# Patient Record
Sex: Male | Born: 1965 | Race: White | Hispanic: No | Smoking: Current every day smoker
Health system: Southern US, Community
[De-identification: ages and names within clinical notes are randomized; demographics above are authoritative.]

## PROBLEM LIST (undated history)

## (undated) DIAGNOSIS — F32A Depression, unspecified: Secondary | ICD-10-CM

## (undated) DIAGNOSIS — K8020 Calculus of gallbladder without cholecystitis without obstruction: Secondary | ICD-10-CM

## (undated) DIAGNOSIS — F329 Major depressive disorder, single episode, unspecified: Secondary | ICD-10-CM

---

## 2018-06-13 ENCOUNTER — Encounter (HOSPITAL_COMMUNITY): Payer: Self-pay

## 2018-06-13 ENCOUNTER — Emergency Department (HOSPITAL_COMMUNITY)
Admission: EM | Admit: 2018-06-13 | Discharge: 2018-06-13 | Disposition: A | Discharge status: Home / Self Care | Payer: Self-pay | Attending: Emergency Medicine | Admitting: Emergency Medicine

## 2018-06-13 DIAGNOSIS — F17200 Nicotine dependence, unspecified, uncomplicated: Secondary | ICD-10-CM | POA: Insufficient documentation

## 2018-06-13 DIAGNOSIS — R1011 Right upper quadrant pain: Secondary | ICD-10-CM | POA: Insufficient documentation

## 2018-06-13 HISTORY — DX: Depression, unspecified: F32.A

## 2018-06-13 HISTORY — DX: Major depressive disorder, single episode, unspecified: F32.9

## 2018-06-13 LAB — COMPREHENSIVE METABOLIC PANEL
ALBUMIN: 4 g/dL (ref 3.5–5.0)
ALT: 16 U/L (ref 0–44)
AST: 18 U/L (ref 15–41)
Alkaline Phosphatase: 42 U/L (ref 38–126)
Anion gap: 5 (ref 5–15)
BILIRUBIN TOTAL: 0.7 mg/dL (ref 0.3–1.2)
BUN: 13 mg/dL (ref 6–20)
CHLORIDE: 102 mmol/L (ref 98–111)
CO2: 30 mmol/L (ref 22–32)
CREATININE: 0.84 mg/dL (ref 0.61–1.24)
Calcium: 9 mg/dL (ref 8.9–10.3)
GFR calc Af Amer: >60 mL/min (ref >60)
GFR calc non Af Amer: >60 mL/min (ref >60)
GLUCOSE: 110 mg/dL — AB (ref 70–99)
POTASSIUM: 4.7 mmol/L (ref 3.5–5.1)
Sodium: 137 mmol/L (ref 135–145)
Total Protein: 7.3 g/dL (ref 6.5–8.1)

## 2018-06-13 LAB — URINALYSIS, ROUTINE W REFLEX MICROSCOPIC
Bilirubin Urine: NEGATIVE
GLUCOSE, UA: NEGATIVE mg/dL
HGB URINE DIPSTICK: NEGATIVE
KETONES UR: NEGATIVE mg/dL
LEUKOCYTES UA: NEGATIVE
NITRITE: NEGATIVE
PROTEIN: NEGATIVE mg/dL
Specific Gravity, Urine: 1.02 (ref 1.005–1.030)
pH: 6 (ref 5.0–8.0)

## 2018-06-13 LAB — CBC
HEMATOCRIT: 44 % (ref 39.0–52.0)
HEMOGLOBIN: 14.6 g/dL (ref 13.0–17.0)
MCH: 31.9 pg (ref 26.0–34.0)
MCHC: 33.2 g/dL (ref 30.0–36.0)
MCV: 96.3 fL (ref 80.0–100.0)
Platelets: 245 10*3/uL (ref 150–400)
RBC: 4.57 MIL/uL (ref 4.22–5.81)
RDW: 12.3 % (ref 11.5–15.5)
WBC: 11.3 10*3/uL — AB (ref 4.0–10.5)
nRBC: 0 % (ref 0.0–0.2)

## 2018-06-13 LAB — LIPASE, BLOOD: LIPASE: 25 U/L (ref 11–51)

## 2018-06-13 MED ORDER — PANTOPRAZOLE SODIUM 20 MG PO TBEC: 20.0000 mg | DELAYED_RELEASE_TABLET | Freq: Every day | ORAL | 0 refills | Status: DC

## 2018-06-13 MED ORDER — LACTATED RINGERS IV BOLUS
1000.0000 mL | Freq: Once | INTRAVENOUS | Status: AC
  Administered 2018-06-13: 1000 mL via INTRAVENOUS

## 2018-06-13 NOTE — ED Triage Notes (Signed)
Pt complains of right sided abdominal pain from his umbilicus to under his rib cage for 8 months intermittently This week it has been more frequent and the pain is severe when it happens Pt states vomiting makes it feel better

## 2018-06-13 NOTE — ED Provider Notes (Addendum)
[facility]-EMERGENCY DEPT Provider Note   CSN: [id number] Arrival date & time: 06/13/18  0551     History   Chief Complaint Chief Complaint  Patient presents with  . Abdominal Pain    HPI Walter Carr. is a 52 y.o. male.  The history is provided by the patient.  Abdominal Pain   This is a chronic problem. The current episode started more than 1 week ago. The problem occurs every several days. Progression since onset:  waxing and waning. The pain is associated with eating. The pain is located in the RUQ and epigastric region. The quality of the pain is aching and dull. The pain is at a severity of 1/10. The pain is mild. Pertinent negatives include anorexia, fever, belching, diarrhea, flatus, hematochezia, melena, nausea, vomiting, constipation, dysuria, frequency, hematuria, headaches, arthralgias and myalgias. Nothing aggravates the symptoms. Nothing relieves the symptoms. Past workup does not include GI consult or surgery.    Past Medical History:  Diagnosis Date  . Depression     There are no active problems to display for this patient.   History reviewed. No pertinent surgical history.      Home Medications    Prior to Admission medications   Medication Sig Start Date End Date Taking? Authorizing Provider  pantoprazole (PROTONIX) 20 MG tablet Take 1 tablet (20 mg total) by mouth daily. 06/13/18 07/13/18  Virgina Norfolk, DO    Family History History reviewed. No pertinent family history.  Social History Social History   Tobacco Use  . Smoking status: Current Every Day Smoker  . Smokeless tobacco: Never Used  Substance Use Topics  . Alcohol use: Never    Frequency: Never  . Drug use: Never     Allergies   Patient has no allergy information on record.   Review of Systems Review of Systems  Constitutional: Negative for chills and fever.  HENT: Negative for ear pain and sore throat.   Eyes: Negative for pain and visual  disturbance.  Respiratory: Negative for cough and shortness of breath.   Cardiovascular: Negative for chest pain and palpitations.  Gastrointestinal: Positive for abdominal pain. Negative for anorexia, constipation, diarrhea, flatus, hematochezia, melena, nausea and vomiting.  Genitourinary: Negative for dysuria, frequency and hematuria.  Musculoskeletal: Negative for arthralgias, back pain and myalgias.  Skin: Negative for color change and rash.  Neurological: Negative for seizures, syncope and headaches.  All other systems reviewed and are negative.    Physical Exam Updated Vital Signs  ED Triage Vitals  Enc Vitals Group     BP 06/13/18 0556 108/75     Pulse Rate 06/13/18 0556 70     Resp 06/13/18 0556 16     Temp 06/13/18 0556 97.9 F (36.6 C)     Temp Source 06/13/18 0556 Oral     SpO2 06/13/18 0556 100 %     Weight --      Height --      Head Circumference --      Peak Flow --      Pain Score 06/13/18 0557 10     Pain Loc --      Pain Edu? --      Excl. in GC? --     Physical Exam  Constitutional: He is oriented to person, place, and time. He appears well-developed and well-nourished.  HENT:  Head: Normocephalic and atraumatic.  Mouth/Throat: No oropharyngeal exudate.  Eyes: Pupils are equal, round, and reactive to light. Conjunctivae and EOM are  normal.  Neck: Neck supple.  Cardiovascular: Normal rate, regular rhythm, normal heart sounds and intact distal pulses.  No murmur heard. Pulmonary/Chest: Effort normal and breath sounds normal. No respiratory distress.  Abdominal: Soft. Normal appearance. There is tenderness in the right upper quadrant. There is no rigidity, no rebound, no guarding, no CVA tenderness, no tenderness at McBurney's point and negative Murphy's sign.  Musculoskeletal: He exhibits no edema.  Neurological: He is alert and oriented to person, place, and time.  Skin: Skin is warm and dry. Capillary refill takes less than 2 seconds.  Psychiatric:  He has a normal mood and affect.  Nursing note and vitals reviewed.    ED Treatments / Results  Labs (all labs ordered are listed, but only abnormal results are displayed) Labs Reviewed  COMPREHENSIVE METABOLIC PANEL - Abnormal; Notable for the following components:      Result Value   Glucose, Bld 110 (*)    All other components within normal limits  CBC - Abnormal; Notable for the following components:   WBC 11.3 (*)    All other components within normal limits  URINALYSIS, ROUTINE W REFLEX MICROSCOPIC - Abnormal; Notable for the following components:   Bacteria, UA RARE (*)    All other components within normal limits  LIPASE, BLOOD    EKG None  Radiology No results found.  Procedures Procedures (including critical care time)  Medications Ordered in ED Medications  lactated ringers bolus 1,000 mL (0 mLs Intravenous Stopped 06/13/18 0853)     Initial Impression / Assessment and Plan / ED Course  I have reviewed the triage vital signs and the nursing notes.  Pertinent labs & imaging results that were available during my care of the patient were reviewed by me and considered in my medical decision making (see chart for details).     Walter Carr. is a 52 year old male with no significant medical history who presents to the ED with intermittent right upper quadrant abdominal pain.  Patient with normal vitals.  No fever.  Pain has been on and off for the last 8 months.  Patient states that pain seems to get worse with eating.  Has not had an evaluation for this as he does not have a primary care doctor.  Nothing specifically has changed about his symptoms today other than that they have been ongoing.  He denies any history of abdominal surgeries.  Denies any nausea, vomiting, diarrhea.  He states that pain is eased off at this point.  Appears to get worse with fatty foods.  Denies any alcohol or drug use.  Patient is mildly tender in the right upper quadrant but  otherwise exam is unremarkable.  No signs of peritonitis.  Patient is overall well-appearing.  Lab work obtained showed no significant hepatobiliary enzyme elevation.  Doubt acute process.  Normal lipase and doubt pancreatitis.  No significant anemia, mitral abnormality, kidney injury, leukocytosis.  Suspect patient possibly with reflux versus biliary colic. Given rx for protonix.  Given information to follow-up with primary care doctor and discharged from ED in good condition. Would benefit from outpatient RUQ u/s.  This chart was dictated using voice recognition software.  Despite best efforts to proofread,  errors can occur which can change the documentation meaning.   Final Clinical Impressions(s) / ED Diagnoses   Final diagnoses:  Right upper quadrant abdominal pain    ED Discharge Orders         Ordered    pantoprazole (PROTONIX) 20 MG tablet  Daily     06/13/18 0853           Virgina Norfolk, DO 06/13/18 1610    Virgina Norfolk, DO 06/13/18 [phone]

## 2019-02-10 ENCOUNTER — Other Ambulatory Visit: Payer: Self-pay

## 2019-02-10 ENCOUNTER — Emergency Department (HOSPITAL_COMMUNITY): Payer: Self-pay

## 2019-02-10 ENCOUNTER — Emergency Department (HOSPITAL_COMMUNITY)
Admission: EM | Admit: 2019-02-10 | Discharge: 2019-02-10 | Disposition: A | Discharge status: Home / Self Care | Payer: Self-pay | Attending: Emergency Medicine | Admitting: Emergency Medicine

## 2019-02-10 ENCOUNTER — Encounter (HOSPITAL_COMMUNITY): Payer: Self-pay | Admitting: Emergency Medicine

## 2019-02-10 DIAGNOSIS — R1011 Right upper quadrant pain: Secondary | ICD-10-CM

## 2019-02-10 DIAGNOSIS — F17200 Nicotine dependence, unspecified, uncomplicated: Secondary | ICD-10-CM | POA: Insufficient documentation

## 2019-02-10 DIAGNOSIS — K8020 Calculus of gallbladder without cholecystitis without obstruction: Secondary | ICD-10-CM | POA: Insufficient documentation

## 2019-02-10 LAB — COMPREHENSIVE METABOLIC PANEL
ALT: 18 U/L (ref 0–44)
AST: 20 U/L (ref 15–41)
Albumin: 3.9 g/dL (ref 3.5–5.0)
Alkaline Phosphatase: 49 U/L (ref 38–126)
Anion gap: 10 (ref 5–15)
BUN: 12 mg/dL (ref 6–20)
CO2: 25 mmol/L (ref 22–32)
Calcium: 9.1 mg/dL (ref 8.9–10.3)
Chloride: 106 mmol/L (ref 98–111)
Creatinine, Ser: 0.89 mg/dL (ref 0.61–1.24)
GFR calc Af Amer: >60 mL/min (ref >60)
GFR calc non Af Amer: >60 mL/min (ref >60)
Glucose, Bld: 128 mg/dL — ABNORMAL HIGH (ref 70–99)
Potassium: 3.7 mmol/L (ref 3.5–5.1)
Sodium: 141 mmol/L (ref 135–145)
Total Bilirubin: 0.6 mg/dL (ref 0.3–1.2)
Total Protein: 6.5 g/dL (ref 6.5–8.1)

## 2019-02-10 LAB — URINALYSIS, ROUTINE W REFLEX MICROSCOPIC
Bilirubin Urine: NEGATIVE
Glucose, UA: NEGATIVE mg/dL
Hgb urine dipstick: NEGATIVE
Ketones, ur: NEGATIVE mg/dL
Leukocytes,Ua: NEGATIVE
Nitrite: NEGATIVE
Protein, ur: NEGATIVE mg/dL
Specific Gravity, Urine: 1.024 (ref 1.005–1.030)
pH: 5 (ref 5.0–8.0)

## 2019-02-10 LAB — CBC
HCT: 46.4 % (ref 39.0–52.0)
Hemoglobin: 15.4 g/dL (ref 13.0–17.0)
MCH: 32.3 pg (ref 26.0–34.0)
MCHC: 33.2 g/dL (ref 30.0–36.0)
MCV: 97.3 fL (ref 80.0–100.0)
Platelets: 253 10*3/uL (ref 150–400)
RBC: 4.77 MIL/uL (ref 4.22–5.81)
RDW: 12 % (ref 11.5–15.5)
WBC: 10.2 10*3/uL (ref 4.0–10.5)
nRBC: 0 % (ref 0.0–0.2)

## 2019-02-10 LAB — LIPASE, BLOOD: Lipase: 27 U/L (ref 11–51)

## 2019-02-10 MED ORDER — SODIUM CHLORIDE 0.9% FLUSH
3.0000 mL | Freq: Once | INTRAVENOUS | Status: AC
  Administered 2019-02-10: 3 mL via INTRAVENOUS

## 2019-02-10 MED ORDER — ALUM & MAG HYDROXIDE-SIMETH 200-200-20 MG/5ML PO SUSP
30.0000 mL | Freq: Once | ORAL | Status: AC
  Administered 2019-02-10: 30 mL via ORAL
  Filled 2019-02-10: qty 30

## 2019-02-10 MED ORDER — MORPHINE SULFATE (PF) 4 MG/ML IV SOLN
4.0000 mg | Freq: Once | INTRAVENOUS | Status: AC
  Administered 2019-02-10: 4 mg via INTRAVENOUS
  Filled 2019-02-10: qty 1

## 2019-02-10 MED ORDER — ONDANSETRON HCL 4 MG PO TABS: 4.0000 mg | ORAL_TABLET | Freq: Four times a day (QID) | ORAL | 0 refills | Status: DC

## 2019-02-10 MED ORDER — ONDANSETRON HCL 4 MG/2ML IJ SOLN
4.0000 mg | Freq: Once | INTRAMUSCULAR | Status: AC
  Administered 2019-02-10: 07:00:00 4 mg via INTRAVENOUS
  Filled 2019-02-10: qty 2

## 2019-02-10 MED ORDER — HYDROCODONE-ACETAMINOPHEN 5-325 MG PO TABS: 1.0000 | ORAL_TABLET | Freq: Four times a day (QID) | ORAL | 0 refills | Status: DC | PRN

## 2019-02-10 NOTE — ED Provider Notes (Signed)
[facility]   CSN: [id number] Arrival date & time: 02/10/19  [id number]     History   Chief Complaint Chief Complaint  Patient presents with  . Abdominal Pain    HPI Walter KnucklesJerry Decelles Jr. is a 53 y.o. male.     HPI  This is a 53 year old male who presents with abdominal pain.  Patient reports onset abdominal pain at night.  He reports sharp epigastric pain that is nonradiating.  He rates his pain at 10 out of 10.  Nothing seems to make it better or worse.  He states the pain has happened previously at night starting approximately 1 month ago.  He reports nausea and vomiting.  No diarrhea.  Denies any history of reflux, pancreatitis.  Denies any alcohol or drug use.  Past Medical History:  Diagnosis Date  . Depression     There are no active problems to display for this patient.   History reviewed. No pertinent surgical history.      Home Medications    Prior to Admission medications   Medication Sig Start Date End Date Taking? Authorizing Provider  pantoprazole (PROTONIX) 20 MG tablet Take 1 tablet (20 mg total) by mouth daily. 06/13/18 07/13/18  Virgina Norfolkuratolo, Adam, DO    Family History No family history on file.  Social History Social History   Tobacco Use  . Smoking status: Current Every Day Smoker  . Smokeless tobacco: Never Used  Substance Use Topics  . Alcohol use: Not Currently    Frequency: Never  . Drug use: Not Currently     Allergies   Elavil [amitriptyline hcl]   Review of Systems Review of Systems  Constitutional: Negative for fever.  Respiratory: Negative for shortness of breath.   Cardiovascular: Negative for chest pain.  Gastrointestinal: Positive for abdominal pain, nausea and vomiting. Negative for diarrhea.  Genitourinary: Negative for dysuria.  Musculoskeletal: Negative for back pain.  Skin: Negative for rash.  Neurological: Negative for facial asymmetry.  All other systems reviewed and  are negative.    Physical Exam Updated Vital Signs BP 131/88 (BP Location: Left Arm)   Pulse 60   Temp 98.3 F (36.8 C) (Oral)   Resp (!) 24   Ht 1.727 m (5\' 8" )   Wt 61.2 kg   SpO2 100%   BMI 20.53 kg/m   Physical Exam Vitals signs and nursing note reviewed.  Constitutional:      General: He is not in acute distress.    Appearance: He is well-developed.  HENT:     Head: Normocephalic and atraumatic.  Neck:     Musculoskeletal: Neck supple.  Cardiovascular:     Rate and Rhythm: Normal rate and regular rhythm.     Heart sounds: Normal heart sounds. No murmur.  Pulmonary:     Effort: Pulmonary effort is normal. No respiratory distress.     Breath sounds: Normal breath sounds. No wheezing.  Abdominal:     General: Bowel sounds are normal.     Palpations: Abdomen is soft.     Tenderness: There is abdominal tenderness in the right upper quadrant and epigastric area. There is no guarding or rebound. Negative signs include Murphy's sign.  Lymphadenopathy:     Cervical: No cervical adenopathy.  Skin:    General: Skin is warm and dry.  Neurological:     Mental Status: He is alert and oriented to person, place, and time.  Psychiatric:        Mood  and Affect: Mood normal.      ED Treatments / Results  Labs (all labs ordered are listed, but only abnormal results are displayed) Labs Reviewed  COMPREHENSIVE METABOLIC PANEL - Abnormal; Notable for the following components:      Result Value   Glucose, Bld 128 (*)    All other components within normal limits  LIPASE, BLOOD  CBC  URINALYSIS, ROUTINE W REFLEX MICROSCOPIC    EKG    Radiology No results found.  Procedures Procedures (including critical care time)  Medications Ordered in ED Medications  sodium chloride flush (NS) 0.9 % injection 3 mL (3 mLs Intravenous Given 02/10/19 0702)  alum & mag hydroxide-simeth (MAALOX/MYLANTA) 200-200-20 MG/5ML suspension 30 mL (30 mLs Oral Given 02/10/19 0701)  morphine 4  MG/ML injection 4 mg (4 mg Intravenous Given 02/10/19 0701)  ondansetron (ZOFRAN) injection 4 mg (4 mg Intravenous Given 02/10/19 0701)     Initial Impression / Assessment and Plan / ED Course  I have reviewed the triage vital signs and the nursing notes.  Pertinent labs & imaging results that were available during my care of the patient were reviewed by me and considered in my medical decision making (see chart for details).        Patient presents with epigastric and right upper quadrant abdominal pain.  Worse at night.  Overall nontoxic-appearing and vital signs are reassuring.  Tenderness on exam without signs of peritonitis.  Considerations include pancreatitis, cholecystitis, gastritis.  Less likely appendicitis or obstruction.  Lab work obtained and reassuring including LFTs and lipase.  Will obtain a right upper quadrant ultrasound.  Patient was given Mylanta and morphine as well as Zofran for his discomfort.  Patient signed out to oncoming provider.  Final Clinical Impressions(s) / ED Diagnoses   Final diagnoses:  RUQ pain    ED Discharge Orders    None       Amere Bricco, Barbette Hair, MD 02/10/19 [phone]

## 2019-02-10 NOTE — ED Notes (Signed)
Pt given discharge instructions, follow up information, prescription information and the opportunity to ask questions. Pt verbalized understanding. IV removed. Pt reports a friend is picking him up. Pt discharged from the Ed without incident.

## 2019-02-10 NOTE — ED Provider Notes (Addendum)
Patient seen after signout from prior ED provider.  Ultrasound demonstrates multiple gallstones.  There is a nonmobile stone seen in the gallbladder neck.  Repeat abdominal exam reveals right upper quadrant tenderness.  Patient will be given an additional dose of morphine now.  Patient's case discussed and findings reviewed with Dr. Annye English of surgery.  We will attempt to control patient's pain.  We will trial p.o.  If patient is comfortable and p.o. trial is successful patient can follow-up as an outpatient with surgery.   1000   Patient is improved.  His pain is controlled.  He is taking good p.o.  He desires discharge.  He does understand the need for close follow-up with surgery.    Valarie Merino, MD 02/10/19 4174    Valarie Merino, MD 02/10/19 [phone]

## 2019-02-10 NOTE — Discharge Instructions (Signed)
Please return for any problem.  Follow-up with Dr. Dema Severin of surgery as instructed.  If you have recurrent pain, fever, nausea, vomiting, or other emergency please return to the ED.

## 2019-02-10 NOTE — ED Triage Notes (Signed)
Patient with abdominal pain, right upper quadrant with guarding.  Patient has had episodes of the same a few months ago.  The pain goes away after vomiting.  No radiation, pain is sharp in nature.  No alcohol, no drugs.  Patient is restless in triage.  No urinary symptoms.  No diarrhea.

## 2019-03-25 ENCOUNTER — Ambulatory Visit: Payer: Self-pay | Admitting: Surgery

## 2019-03-25 NOTE — H&P (Signed)
Hardie Pulley Documented: 03/25/2019 3:14 PM Location: [facility] Patient #: [id number] DOB: 07/08/1966 Single / Language: Walter Carr / Race: White Male  History of Present Illness Marcello Moores A. Armari Fussell MD; 03/25/2019 3:32 PM) Patient words: Patient sent at the request of Dr. Francia Greaves for right upper quadrant abdominal pain. The patient relates a history of last 6 months of multiple attacks of right upper quarter bowel pain primarily in the evening after dinner. The attacks occur about 2 hours after eating. The pain is described as sharp without radiation with nausea but no vomiting. He does have some back pain with that the pain last minutes to hours. He was last seen in emergency room at the end of June 2020 for this . He states his last attack was on Saturday but subsided on its own.           CLINICAL DATA: Right upper quadrant pain since this morning.  EXAM: ULTRASOUND ABDOMEN LIMITED RIGHT UPPER QUADRANT  COMPARISON: None.  FINDINGS: Gallbladder:  Moderate cholelithiasis with largest stone measuring 1.8 cm. There is a non mobile stone over the gallbladder neck. No evidence of gallbladder wall thickening. Sonographic Murphy sign not reliable as patient is on pain medication. Tiny amount of pericholecystic fluid.  Common bile duct:  Diameter: 3.8 mm.  Liver:  No focal lesion identified. Within normal limits in parenchymal echogenicity. Portal vein is patent on color Doppler imaging with normal direction of blood flow towards the liver.  IMPRESSION: Moderate cholelithiasis without additional sonographic evidence to suggest cholecystitis.   Electronically Signed By: Marin Olp M.D. On: 02/10/2019 08:10     Results for ERICH, KOCHAN (MRN [id number]) as of 03/25/2019 15:19 Ref. Range 02/10/2019 04:52 Sodium Latest Ref Range: 135 - 145 mmol/L 141 Potassium Latest Ref Range: 3.5 - 5.1 mmol/L 3.7 Chloride Latest Ref Range: 98 - 111 mmol/L 106 CO2  Latest Ref Range: 22 - 32 mmol/L 25 Glucose Latest Ref Range: 70 - 99 mg/dL 128 (H) BUN Latest Ref Range: 6 - 20 mg/dL 12 Creatinine Latest Ref Range: 0.61 - 1.24 mg/dL 0.89 Calcium Latest Ref Range: 8.9 - 10.3 mg/dL 9.1 Anion gap Latest Ref Range: 5 - 15 10 Alkaline Phosphatase Latest Ref Range: 38 - 126 U/L 49 Albumin Latest Ref Range: 3.5 - 5.0 g/dL 3.9 Lipase Latest Ref Range: 11 - 51 U/L 27 AST Latest Ref Range: 15 - 41 U/L 20 ALT Latest Ref Range: 0 - 44 U/L 18 Total Protein Latest Ref Range: 6.5 - 8.1 g/dL 6.5 Total Bilirubin Latest Ref Range: 0.3 - 1.2 mg/dL 0.6 GFR, Est Non African American Latest Ref Range: >60 mL/min >60 GFR, Est African American Latest Ref Range: >60 mL/min >60 WBC Latest Ref Range: 4.0 - 10.5 K/uL 10.2 RBC Latest Ref Range: 4.22 - 5.81 MIL/uL 4.77 Hemoglobin Latest Ref Range: 13.0 - 17.0 g/dL 15.4 HCT Latest Ref Range: 39.0 - 52.0 % 46.4 MCV Latest Ref Range: 80.0 - 100.0 fL 97.3 MCH Latest Ref Range: 26.0 - 34.0 pg 32.3 MCHC Latest Ref Range: 30.0 - 36.0 g/dL 33.2 RDW Latest Ref Range: 11.5 - 15.5 % 12.0 Platelets Latest Ref Range: 150 - 400 K/uL 253 nRBC Latest Ref Range: 0.0 - 0.2 % 0.0.  The patient is a 53 year old male.   Past Surgical History (Tanisha A. Owens Shark, [place]; 03/25/2019 3:15 PM) No pertinent past surgical history  Diagnostic Studies History (Tanisha A. Owens Shark, [place]; 03/25/2019 3:15 PM) Colonoscopy never  Allergies (Tanisha A. Owens Shark, [place]; 03/25/2019 3:16 PM)  Elavil *ANTIDEPRESSANTS* Allergies Reconciled  Medication History (Tanisha A. Manson PasseyBrown, RMA; 03/25/2019 3:16 PM) Vicodin (5-300MG  Tablet, Oral) Active. Medications Reconciled  Social History (Tanisha A. Manson PasseyBrown, RMA; 03/25/2019 3:15 PM) Alcohol use Remotely quit alcohol use. Caffeine use Carbonated beverages, Coffee, Tea. Illicit drug use Remotely quit drug use. Tobacco use Current some day smoker.  Family History (Tanisha A. Manson PasseyBrown, RMA; 03/25/2019 3:15 PM) Family history  unknown First Degree Relatives  Other Problems (Tanisha A. Manson PasseyBrown, RMA; 03/25/2019 3:15 PM) Back Pain Cholelithiasis Depression Migraine Headache     Review of Systems (Tanisha A. Brown RMA; 03/25/2019 3:15 PM) General Not Present- Appetite Loss, Chills, Fatigue, Fever, Night Sweats, Weight Gain and Weight Loss. Skin Not Present- Change in Wart/Mole, Dryness, Hives, Jaundice, New Lesions, Non-Healing Wounds, Rash and Ulcer. HEENT Present- Wears glasses/contact lenses. Not Present- Earache, Hearing Loss, Hoarseness, Nose Bleed, Oral Ulcers, Ringing in the Ears, Seasonal Allergies, Sinus Pain, Sore Throat, Visual Disturbances and Yellow Eyes. Respiratory Not Present- Bloody sputum, Chronic Cough, Difficulty Breathing, Snoring and Wheezing. Breast Not Present- Breast Mass, Breast Pain, Nipple Discharge and Skin Changes. Cardiovascular Not Present- Chest Pain, Difficulty Breathing Lying Down, Leg Cramps, Palpitations, Rapid Heart Rate, Shortness of Breath and Swelling of Extremities. Gastrointestinal Present- Abdominal Pain. Not Present- Bloating, Bloody Stool, Change in Bowel Habits, Chronic diarrhea, Constipation, Difficulty Swallowing, Excessive gas, Gets full quickly at meals, Hemorrhoids, Indigestion, Nausea, Rectal Pain and Vomiting. Male Genitourinary Not Present- Blood in Urine, Change in Urinary Stream, Frequency, Impotence, Nocturia, Painful Urination, Urgency and Urine Leakage. Musculoskeletal Not Present- Back Pain, Joint Pain, Joint Stiffness, Muscle Pain, Muscle Weakness and Swelling of Extremities. Neurological Not Present- Decreased Memory, Fainting, Headaches, Numbness, Seizures, Tingling, Tremor, Trouble walking and Weakness. Psychiatric Not Present- Anxiety, Bipolar, Change in Sleep Pattern, Depression, Fearful and Frequent crying. Endocrine Not Present- Cold Intolerance, Excessive Hunger, Hair Changes, Heat Intolerance, Hot flashes and New Diabetes. Hematology Not Present-  Blood Thinners, Easy Bruising, Excessive bleeding, Gland problems, HIV and Persistent Infections.  Vitals (Tanisha A. Brown RMA; 03/25/2019 3:15 PM) 03/25/2019 3:15 PM Weight: 132 lb Height: 68in Body Surface Area: 1.71 m Body Mass Index: 20.07 kg/m  Temp.: 97.67F  Pulse: 82 (Regular)  BP: 132/76 (Sitting, Left Arm, Standard)        Physical Exam (Marybel Alcott A. Neale Marzette MD; 03/25/2019 3:32 PM)  General Mental Status-Alert. General Appearance-Consistent with stated age. Hydration-Well hydrated. Voice-Normal.  Head and Neck Head-normocephalic, atraumatic with no lesions or palpable masses.  Eye Eyeball - Bilateral-Extraocular movements intact. Sclera/Conjunctiva - Bilateral-No scleral icterus.  Chest and Lung Exam Chest and lung exam reveals -quiet, even and easy respiratory effort with no use of accessory muscles and on auscultation, normal breath sounds, no adventitious sounds and normal vocal resonance. Inspection Chest Wall - Normal. Back - normal.  Cardiovascular Cardiovascular examination reveals -on palpation PMI is normal in location and amplitude, no palpable S3 or S4. Normal cardiac borders., normal heart sounds, regular rate and rhythm with no murmurs, carotid auscultation reveals no bruits and normal pedal pulses bilaterally.  Abdomen Inspection Inspection of the abdomen reveals - No Hernias. Skin - Scar - no surgical scars. Palpation/Percussion Palpation and Percussion of the abdomen reveal - Soft, Non Tender, No Rebound tenderness, No Rigidity (guarding) and No hepatosplenomegaly. Auscultation Auscultation of the abdomen reveals - Bowel sounds normal.  Neurologic Neurologic evaluation reveals -alert and oriented x 3 with no impairment of recent or remote memory. Mental Status-Normal.  Musculoskeletal Normal Exam - Left-Upper Extremity Strength Normal and Lower Extremity Strength  Normal. Normal Exam - Right-Upper Extremity  Strength Normal, Lower Extremity Weakness.    Assessment & Plan (Coburn Knaus A. Jakiera Ehler MD; 03/25/2019 3:32 PM)  CHRONIC CHOLECYSTITIS WITH CALCULUS (K80.10) Impression: Discuss treatments for cholelithiasis and symptomatic cholelithiasis. Medical and surgical options discussed.covid risk discussed   He has opted for laparoscopic cholecystectomy with possible cholangiogram The procedure has been discussed with the patient. Risks of laparoscopic cholecystectomy include bleeding, infection, bile duct injury, leak, death, open surgery, diarrhea, other surgery, organ injury, blood vessel injury, DVT, and additional care.  Current Plans You are being scheduled for surgery- Our schedulers will call you.  You should hear from our office's scheduling department within 5 working days about the location, date, and time of surgery. We try to make accommodations for patient's preferences in scheduling surgery, but sometimes the OR schedule or the surgeon's schedule prevents us from making those accommodations.  If you have not heard from our office [phone]) in 5 working days, call the office and ask for your surgeon's nurse.  If you have other questions about your diagnosis, plan, or surgery, call the office and ask for your surgeon's nurse.  Pt Education - Pamphlet Given - Laparoscopic Gallbladder Surgery: discussed with patient and provided information. The anatomy & physiology of hepatobiliary & pancreatic function was discussed. The pathophysiology of gallbladder dysfunction was discussed. Natural history risks without surgery was discussed. I feel the risks of no intervention will lead to serious problems that outweigh the operative risks; therefore, I recommended cholecystectomy to remove the pathology. I explained laparoscopic techniques with possible need for an open approach. Probable cholangiogram to evaluate the bilary tract was explained as well.  Risks such as bleeding, infection,  abscess, leak, injury to other organs, need for further treatment, heart attack, death, and other risks were discussed. I noted a good likelihood this will help address the problem. Possibility that this will not correct all abdominal symptoms was explained. Goals of post-operative recovery were discussed as well. We will work to minimize complications. An educational handout further explaining the pathology and treatment options was given as well. Questions were answered. The patient expresses understanding & wishes to proceed with surgery.  Pt Education - [facility] Laparosopic Post Op HCI (Gross)

## 2019-12-22 ENCOUNTER — Emergency Department (HOSPITAL_COMMUNITY)
Admission: EM | Admit: 2019-12-22 | Discharge: 2019-12-22 | Disposition: A | Discharge status: Home / Self Care | Payer: Self-pay | Attending: Emergency Medicine | Admitting: Emergency Medicine

## 2019-12-22 ENCOUNTER — Emergency Department (HOSPITAL_COMMUNITY): Payer: Self-pay

## 2019-12-22 ENCOUNTER — Encounter (HOSPITAL_COMMUNITY): Payer: Self-pay

## 2019-12-22 DIAGNOSIS — K8020 Calculus of gallbladder without cholecystitis without obstruction: Secondary | ICD-10-CM | POA: Insufficient documentation

## 2019-12-22 DIAGNOSIS — F17200 Nicotine dependence, unspecified, uncomplicated: Secondary | ICD-10-CM | POA: Insufficient documentation

## 2019-12-22 DIAGNOSIS — K8050 Calculus of bile duct without cholangitis or cholecystitis without obstruction: Secondary | ICD-10-CM

## 2019-12-22 DIAGNOSIS — R109 Unspecified abdominal pain: Secondary | ICD-10-CM

## 2019-12-22 DIAGNOSIS — Z79899 Other long term (current) drug therapy: Secondary | ICD-10-CM | POA: Insufficient documentation

## 2019-12-22 LAB — COMPREHENSIVE METABOLIC PANEL
ALT: 20 U/L (ref 0–44)
AST: 23 U/L (ref 15–41)
Albumin: 4.5 g/dL (ref 3.5–5.0)
Alkaline Phosphatase: 41 U/L (ref 38–126)
Anion gap: 10 (ref 5–15)
BUN: 15 mg/dL (ref 6–20)
CO2: 26 mmol/L (ref 22–32)
Calcium: 9.4 mg/dL (ref 8.9–10.3)
Chloride: 103 mmol/L (ref 98–111)
Creatinine, Ser: 0.99 mg/dL (ref 0.61–1.24)
GFR calc Af Amer: >60 mL/min (ref >60)
GFR calc non Af Amer: >60 mL/min (ref >60)
Glucose, Bld: 105 mg/dL — ABNORMAL HIGH (ref 70–99)
Potassium: 4.5 mmol/L (ref 3.5–5.1)
Sodium: 139 mmol/L (ref 135–145)
Total Bilirubin: 0.7 mg/dL (ref 0.3–1.2)
Total Protein: 7.3 g/dL (ref 6.5–8.1)

## 2019-12-22 LAB — CBC WITH DIFFERENTIAL/PLATELET
Abs Immature Granulocytes: 0.04 10*3/uL (ref 0.00–0.07)
Basophils Absolute: 0.1 10*3/uL (ref 0.0–0.1)
Basophils Relative: 1 %
Eosinophils Absolute: 0.3 10*3/uL (ref 0.0–0.5)
Eosinophils Relative: 3 %
HCT: 45.4 % (ref 39.0–52.0)
Hemoglobin: 15.4 g/dL (ref 13.0–17.0)
Immature Granulocytes: 0 %
Lymphocytes Relative: 24 %
Lymphs Abs: 2.5 10*3/uL (ref 0.7–4.0)
MCH: 32.5 pg (ref 26.0–34.0)
MCHC: 33.9 g/dL (ref 30.0–36.0)
MCV: 95.8 fL (ref 80.0–100.0)
Monocytes Absolute: 1 10*3/uL (ref 0.1–1.0)
Monocytes Relative: 9 %
Neutro Abs: 6.4 10*3/uL (ref 1.7–7.7)
Neutrophils Relative %: 63 %
Platelets: 279 10*3/uL (ref 150–400)
RBC: 4.74 MIL/uL (ref 4.22–5.81)
RDW: 12.3 % (ref 11.5–15.5)
WBC: 10.3 10*3/uL (ref 4.0–10.5)
nRBC: 0 % (ref 0.0–0.2)

## 2019-12-22 LAB — LIPASE, BLOOD: Lipase: 24 U/L (ref 11–51)

## 2019-12-22 MED ORDER — FENTANYL CITRATE (PF) 100 MCG/2ML IJ SOLN
50.0000 ug | Freq: Once | INTRAMUSCULAR | Status: AC
  Administered 2019-12-22: 21:00:00 50 ug via INTRAVENOUS
  Filled 2019-12-22: qty 2

## 2019-12-22 MED ORDER — MORPHINE SULFATE (PF) 4 MG/ML IV SOLN
4.0000 mg | Freq: Once | INTRAVENOUS | Status: AC
  Administered 2019-12-22: 4 mg via INTRAVENOUS
  Filled 2019-12-22: qty 1

## 2019-12-22 MED ORDER — HYDROCODONE-ACETAMINOPHEN 5-325 MG PO TABS: 1.0000 | ORAL_TABLET | Freq: Four times a day (QID) | ORAL | 0 refills | Status: DC | PRN

## 2019-12-22 MED ORDER — FENTANYL CITRATE (PF) 100 MCG/2ML IJ SOLN
50.0000 ug | Freq: Once | INTRAMUSCULAR | Status: AC
  Administered 2019-12-22: 50 ug via INTRAVENOUS
  Filled 2019-12-22: qty 2

## 2019-12-22 MED ORDER — HYDROCODONE-ACETAMINOPHEN 5-325 MG PO TABS
1.0000 | ORAL_TABLET | Freq: Once | ORAL | Status: AC
  Administered 2019-12-22: 1 via ORAL
  Filled 2019-12-22: qty 1

## 2019-12-22 MED ORDER — ONDANSETRON 4 MG PO TBDP
4.0000 mg | ORAL_TABLET | Freq: Three times a day (TID) | ORAL | 0 refills | Status: DC | PRN
Start: 2019-12-22 — End: 2020-01-13

## 2019-12-22 MED ORDER — SODIUM CHLORIDE 0.9 % IV BOLUS
1000.0000 mL | Freq: Once | INTRAVENOUS | Status: AC
  Administered 2019-12-22: 1000 mL via INTRAVENOUS

## 2019-12-22 MED ORDER — ONDANSETRON HCL 4 MG/2ML IJ SOLN
4.0000 mg | Freq: Once | INTRAMUSCULAR | Status: AC
  Administered 2019-12-22: 22:00:00 4 mg via INTRAVENOUS
  Filled 2019-12-22: qty 2

## 2019-12-22 MED ORDER — ONDANSETRON HCL 4 MG/2ML IJ SOLN
4.0000 mg | Freq: Once | INTRAMUSCULAR | Status: AC
  Administered 2019-12-22: 4 mg via INTRAVENOUS
  Filled 2019-12-22: qty 2

## 2019-12-22 NOTE — ED Provider Notes (Signed)
[name] Provider Note   CSN: [id number] Arrival date & time: 12/22/19  1931     History Chief Complaint  Patient presents with  . Abdominal Pain    Walter Carr. is a 54 y.o. male who presents for evaluation right upper quadrant abdominal pain, nausea/vomiting that began about 3 hours prior to ED arrival.  Patient reports that he ate macaroni and cheese and chicken today.  He states about 3 hours ago, he started experiencing right upper quadrant abdominal pain followed by nausea/vomiting.  Vomiting is nonbloody, nonbilious.  He has not taken anything for pain.  He reports that he felt fine prior to onset of symptoms.  He states he has had this pain for about a year ago and was told that his gallbladder needed to come out.  At that time, he did not get it taken out.  His last bowel movement was earlier this morning and was normal.  He denies any fevers, chest pain, difficulty breathing, dysuria, hematuria.  The history is provided by the patient.       Past Medical History:  Diagnosis Date  . Depression     There are no problems to display for this patient.   History reviewed. No pertinent surgical history.     History reviewed. No pertinent family history.  Social History   Tobacco Use  . Smoking status: Current Every Day Smoker  . Smokeless tobacco: Never Used  Substance Use Topics  . Alcohol use: Not Currently  . Drug use: Not Currently    Home Medications Prior to Admission medications   Medication Sig Start Date End Date Taking? Authorizing Provider  HYDROcodone-acetaminophen (NORCO/VICODIN) 5-325 MG tablet Take 1-2 tablets by mouth every 6 (six) hours as needed. 12/22/19   Volanda Napoleon, PA-C  ondansetron (ZOFRAN ODT) 4 MG disintegrating tablet Take 1 tablet (4 mg total) by mouth every 8 (eight) hours as needed for nausea or vomiting. 12/22/19   Volanda Napoleon, PA-C  ondansetron (ZOFRAN) 4 MG tablet Take 1 tablet (4  mg total) by mouth every 6 (six) hours. 02/10/19   Valarie Merino, MD  QUEtiapine Fumarate (SEROQUEL XR) 150 MG 24 hr tablet Take 150 mg by mouth at bedtime. 12/10/18   [provider]    Allergies    Fish allergy and Elavil [amitriptyline hcl]  Review of Systems   Review of Systems  Constitutional: Negative for fever.  Respiratory: Negative for cough and shortness of breath.   Cardiovascular: Negative for chest pain.  Gastrointestinal: Positive for abdominal pain, nausea and vomiting.  Genitourinary: Negative for dysuria and hematuria.  Neurological: Negative for headaches.  All other systems reviewed and are negative.   Physical Exam Updated Vital Signs BP 104/69   Pulse 75   Temp 98.3 F (36.8 C) (Oral)   Resp 14   SpO2 97%   Physical Exam Vitals and nursing note reviewed.  Constitutional:      Appearance: Normal appearance. He is well-developed.     Comments: Actively vomiting.  HENT:     Head: Normocephalic and atraumatic.  Eyes:     General: Lids are normal.     Conjunctiva/sclera: Conjunctivae normal.     Pupils: Pupils are equal, round, and reactive to light.  Cardiovascular:     Rate and Rhythm: Normal rate and regular rhythm.     Pulses: Normal pulses.     Heart sounds: Normal heart sounds. No murmur. No friction rub. No gallop.  Pulmonary:     Effort: Pulmonary effort is normal.     Breath sounds: Normal breath sounds.  Abdominal:     Palpations: Abdomen is soft. Abdomen is not rigid.     Tenderness: There is abdominal tenderness in the right upper quadrant. There is no guarding.     Comments: Tenderness palpation of the right upper quadrant.  No rigidity, guarding.  No CVA tenderness noted bilaterally.  Musculoskeletal:        General: Normal range of motion.     Cervical back: Full passive range of motion without pain.  Skin:    General: Skin is warm and dry.     Capillary Refill: Capillary refill takes less than 2 seconds.  Neurological:       Mental Status: He is alert and oriented to person, place, and time.  Psychiatric:        Speech: Speech normal.     ED Results / Procedures / Treatments   Labs (all labs ordered are listed, but only abnormal results are displayed) Labs Reviewed  COMPREHENSIVE METABOLIC PANEL - Abnormal; Notable for the following components:      Result Value   Glucose, Bld 105 (*)    All other components within normal limits  CBC WITH DIFFERENTIAL/PLATELET  LIPASE, BLOOD  URINALYSIS, ROUTINE W REFLEX MICROSCOPIC    EKG None  Radiology US Abdomen Limited RUQ  Result Date: 12/22/2019 CLINICAL DATA:  Right upper quadrant abdominal pain x5 hours EXAM: ULTRASOUND ABDOMEN LIMITED RIGHT UPPER QUADRANT COMPARISON:  March 12, 2019 FINDINGS: Gallbladder: There is cholelithiasis without evidence for gallbladder wall thickening or pericholecystic free fluid. The sonographic Eulah Pont sign is negative. Common bile duct: Diameter: 4 mm Liver: No focal lesion identified. Within normal limits in parenchymal echogenicity. Portal vein is patent on color Doppler imaging with normal direction of blood flow towards the liver. Other: None. IMPRESSION: There is cholelithiasis without secondary signs of acute cholecystitis. Electronically Signed   By: Katherine Mantle M.D.   On: 12/22/2019 20:56    Procedures Procedures (including critical care time)  Medications Ordered in ED Medications  sodium chloride 0.9 % bolus 1,000 mL (0 mLs Intravenous Stopped 12/22/19 2126)  morphine 4 MG/ML injection 4 mg (4 mg Intravenous Given 12/22/19 2003)  ondansetron (ZOFRAN) injection 4 mg (4 mg Intravenous Given 12/22/19 2004)  fentaNYL (SUBLIMAZE) injection 50 mcg (50 mcg Intravenous Given 12/22/19 2126)  fentaNYL (SUBLIMAZE) injection 50 mcg (50 mcg Intravenous Given 12/22/19 2215)  ondansetron (ZOFRAN) injection 4 mg (4 mg Intravenous Given 12/22/19 2217)  HYDROcodone-acetaminophen (NORCO/VICODIN) 5-325 MG per tablet 1 tablet (1 tablet  Oral Given 12/22/19 2238)    ED Course  I have reviewed the triage vital signs and the nursing notes.  Pertinent labs & imaging results that were available during my care of the patient were reviewed by me and considered in my medical decision making (see chart for details).    MDM Rules/Calculators/A&P                      54 year old male who presents for evaluation of right upper quadrant abdominal pain, nausea/vomiting that began about 3 to 4 hours prior to ED arrival.  History of gallstones and states he was told he needed his gallbladder taken out.  He followed up with surgery but never got his gallbladder taken out.  No fevers.  On initially arrival, he is actively vomiting and appears uncomfortable.  On exam, he has tenderness palpation of the  right upper quadrant.  Consider infectious etiology versus hepatobiliary etiology.  Plan for labs, right upper quadrant sound.  Lipase normal.  CBC shows no leukocytosis or anemia.  CMP is unremarkable.  Ultrasound shows cholelithiasis without any secondary signs of acute cholecystitis.  Reevaluation.  Patient is resting comfortably.  He still has some mild tenderness but has not had any more active vomiting and appears much more comfortable.  We will plan to p.o. challenge.  Patient able to tolerate p.o. without any difficulty.  Patient is stable to be discharged home.  I reviewed his PMP with no active recent prescription.  We will give him a short course of pain medication.  Instructed patient that he will need to have outpatient follow-up with [facility] surgery for further evaluation of his symptoms.  At this time, suspect his symptoms are likely related to biliary colic. At this time, patient exhibits no emergent life-threatening condition that require further evaluation in ED or admission. Patient had ample opportunity for questions and discussion. All patient's questions were answered with full understanding. Strict return precautions discussed.  Patient expresses understanding and agreement to plan.   Portions of this note were generated with Scientist, clinical (histocompatibility and immunogenetics). Dictation errors may occur despite best attempts at proofreading.   Final Clinical Impression(s) / ED Diagnoses Final diagnoses:  Abdominal pain  Biliary colic  Calculus of gallbladder without cholecystitis without obstruction    Rx / DC Orders ED Discharge Orders         Ordered    HYDROcodone-acetaminophen (NORCO/VICODIN) 5-325 MG tablet  Every 6 hours PRN     12/22/19 2231    ondansetron (ZOFRAN ODT) 4 MG disintegrating tablet  Every 8 hours PRN     12/22/19 2231           Rosana Hoes 12/22/19 2316    Pricilla Loveless, MD 12/23/19 1733

## 2019-12-22 NOTE — ED Notes (Signed)
Patient tolerating water at this time. States he has vomited after zofran administration and does not want to attempt any crackers at this time.

## 2019-12-22 NOTE — Discharge Instructions (Signed)
As we discussed, your ultrasound did show evidence of gallstones.  No evidence of infection was seen.  You are likely having pain secondary to biliary colic.  As we discussed, you will need to follow-up with [facility] surge for evaluation of ultimately getting her gallbladder taken out.  I provided you with a short course of pain medication and nausea medication.  Return the emergency department for any worsening pain, vomiting, difficulty breathing, fevers or any other worsening or concerning symptoms.

## 2019-12-22 NOTE — ED Triage Notes (Signed)
Pt reports R sided abdominal pain. Reports a hx of cholecystitis, but was unable to have his gallbladder removed at the time. Pain started 3 hours ago. Patient appears very uncomfortable- grimacing, moaning, and unable to sit still.   140/72, 100 HR, 24 RR, 99%, 98.2

## 2020-01-13 ENCOUNTER — Emergency Department (HOSPITAL_COMMUNITY): Payer: Self-pay | Admitting: Certified Registered Nurse Anesthetist

## 2020-01-13 ENCOUNTER — Emergency Department (HOSPITAL_COMMUNITY): Payer: Self-pay

## 2020-01-13 ENCOUNTER — Other Ambulatory Visit: Payer: Self-pay

## 2020-01-13 ENCOUNTER — Encounter (HOSPITAL_COMMUNITY)
Admission: EM | Disposition: A | Discharge status: Home / Self Care | Payer: Self-pay | Source: Home / Self Care | Attending: Emergency Medicine

## 2020-01-13 ENCOUNTER — Encounter (HOSPITAL_COMMUNITY): Payer: Self-pay | Admitting: *Deleted

## 2020-01-13 ENCOUNTER — Ambulatory Visit (HOSPITAL_COMMUNITY)
Admission: EM | Admit: 2020-01-13 | Discharge: 2020-01-13 | Disposition: A | Discharge status: Home / Self Care | Payer: Self-pay | Attending: Surgery | Admitting: Surgery

## 2020-01-13 DIAGNOSIS — R1011 Right upper quadrant pain: Secondary | ICD-10-CM

## 2020-01-13 DIAGNOSIS — R52 Pain, unspecified: Secondary | ICD-10-CM

## 2020-01-13 DIAGNOSIS — R112 Nausea with vomiting, unspecified: Secondary | ICD-10-CM

## 2020-01-13 DIAGNOSIS — R609 Edema, unspecified: Secondary | ICD-10-CM | POA: Insufficient documentation

## 2020-01-13 DIAGNOSIS — K8010 Calculus of gallbladder with chronic cholecystitis without obstruction: Secondary | ICD-10-CM | POA: Insufficient documentation

## 2020-01-13 DIAGNOSIS — F329 Major depressive disorder, single episode, unspecified: Secondary | ICD-10-CM | POA: Insufficient documentation

## 2020-01-13 DIAGNOSIS — F17210 Nicotine dependence, cigarettes, uncomplicated: Secondary | ICD-10-CM | POA: Insufficient documentation

## 2020-01-13 DIAGNOSIS — K8020 Calculus of gallbladder without cholecystitis without obstruction: Secondary | ICD-10-CM

## 2020-01-13 DIAGNOSIS — Z419 Encounter for procedure for purposes other than remedying health state, unspecified: Secondary | ICD-10-CM

## 2020-01-13 DIAGNOSIS — Z20822 Contact with and (suspected) exposure to covid-19: Secondary | ICD-10-CM | POA: Insufficient documentation

## 2020-01-13 HISTORY — DX: Calculus of gallbladder without cholecystitis without obstruction: K80.20

## 2020-01-13 HISTORY — PX: CHOLECYSTECTOMY: SHX55

## 2020-01-13 LAB — CBC
HCT: 43.9 % (ref 39.0–52.0)
Hemoglobin: 15.1 g/dL (ref 13.0–17.0)
MCH: 32.8 pg (ref 26.0–34.0)
MCHC: 34.4 g/dL (ref 30.0–36.0)
MCV: 95.2 fL (ref 80.0–100.0)
Platelets: 278 10*3/uL (ref 150–400)
RBC: 4.61 MIL/uL (ref 4.22–5.81)
RDW: 12.1 % (ref 11.5–15.5)
WBC: 18.5 10*3/uL — ABNORMAL HIGH (ref 4.0–10.5)
nRBC: 0 % (ref 0.0–0.2)

## 2020-01-13 LAB — URINALYSIS, ROUTINE W REFLEX MICROSCOPIC
Bilirubin Urine: NEGATIVE
Glucose, UA: NEGATIVE mg/dL
Hgb urine dipstick: NEGATIVE
Ketones, ur: NEGATIVE mg/dL
Leukocytes,Ua: NEGATIVE
Nitrite: NEGATIVE
Protein, ur: NEGATIVE mg/dL
Specific Gravity, Urine: 1.02 (ref 1.005–1.030)
pH: 6 (ref 5.0–8.0)

## 2020-01-13 LAB — COMPREHENSIVE METABOLIC PANEL
ALT: 21 U/L (ref 0–44)
AST: 19 U/L (ref 15–41)
Albumin: 4.2 g/dL (ref 3.5–5.0)
Alkaline Phosphatase: 46 U/L (ref 38–126)
Anion gap: 10 (ref 5–15)
BUN: 11 mg/dL (ref 6–20)
CO2: 26 mmol/L (ref 22–32)
Calcium: 8.7 mg/dL — ABNORMAL LOW (ref 8.9–10.3)
Chloride: 103 mmol/L (ref 98–111)
Creatinine, Ser: 0.82 mg/dL (ref 0.61–1.24)
GFR calc Af Amer: >60 mL/min (ref >60)
GFR calc non Af Amer: >60 mL/min (ref >60)
Glucose, Bld: 134 mg/dL — ABNORMAL HIGH (ref 70–99)
Potassium: 3.9 mmol/L (ref 3.5–5.1)
Sodium: 139 mmol/L (ref 135–145)
Total Bilirubin: 0.4 mg/dL (ref 0.3–1.2)
Total Protein: 6.7 g/dL (ref 6.5–8.1)

## 2020-01-13 LAB — LIPASE, BLOOD: Lipase: 21 U/L (ref 11–51)

## 2020-01-13 LAB — SARS CORONAVIRUS 2 BY RT PCR (HOSPITAL ORDER, PERFORMED IN ~~LOC~~ HOSPITAL LAB): SARS Coronavirus 2: NEGATIVE

## 2020-01-13 SURGERY — LAPAROSCOPIC CHOLECYSTECTOMY WITH INTRAOPERATIVE CHOLANGIOGRAM
Anesthesia: General

## 2020-01-13 MED ORDER — LACTATED RINGERS IR SOLN
Status: DC | PRN
  Administered 2020-01-13: 1000 mL

## 2020-01-13 MED ORDER — ROCURONIUM BROMIDE 10 MG/ML (PF) SYRINGE
PREFILLED_SYRINGE | INTRAVENOUS | Status: DC | PRN
  Administered 2020-01-13: 40 mg via INTRAVENOUS

## 2020-01-13 MED ORDER — SODIUM CHLORIDE 0.9 % IV SOLN
2.0000 g | INTRAVENOUS | Status: DC
  Administered 2020-01-13: 2 g via INTRAVENOUS
  Filled 2020-01-13: qty 20

## 2020-01-13 MED ORDER — FENTANYL CITRATE (PF) 100 MCG/2ML IJ SOLN
50.0000 ug | Freq: Once | INTRAMUSCULAR | Status: AC
  Administered 2020-01-13: 50 ug via INTRAVENOUS
  Filled 2020-01-13: qty 2

## 2020-01-13 MED ORDER — OXYCODONE HCL 5 MG PO TABS: 5.0000 mg | ORAL_TABLET | Freq: Four times a day (QID) | ORAL | 0 refills | Status: DC | PRN

## 2020-01-13 MED ORDER — LACTATED RINGERS IV SOLN: INTRAVENOUS | Status: DC

## 2020-01-13 MED ORDER — FENTANYL CITRATE (PF) 100 MCG/2ML IJ SOLN
25.0000 ug | INTRAMUSCULAR | Status: DC | PRN
  Administered 2020-01-13 (×2): 50 ug via INTRAVENOUS

## 2020-01-13 MED ORDER — SUGAMMADEX SODIUM 200 MG/2ML IV SOLN
INTRAVENOUS | Status: DC | PRN
  Administered 2020-01-13: 120 mg via INTRAVENOUS

## 2020-01-13 MED ORDER — LIDOCAINE 2% (20 MG/ML) 5 ML SYRINGE
INTRAMUSCULAR | Status: DC | PRN
  Administered 2020-01-13: 40 mg via INTRAVENOUS

## 2020-01-13 MED ORDER — MIDAZOLAM HCL 2 MG/2ML IJ SOLN
INTRAMUSCULAR | Status: AC
  Filled 2020-01-13: qty 2

## 2020-01-13 MED ORDER — SUCCINYLCHOLINE CHLORIDE 200 MG/10ML IV SOSY
PREFILLED_SYRINGE | INTRAVENOUS | Status: DC | PRN
  Administered 2020-01-13: 100 mg via INTRAVENOUS

## 2020-01-13 MED ORDER — EPHEDRINE 5 MG/ML INJ
INTRAVENOUS | Status: AC
  Filled 2020-01-13: qty 10

## 2020-01-13 MED ORDER — BUPIVACAINE HCL (PF) 0.25 % IJ SOLN
INTRAMUSCULAR | Status: DC | PRN
  Administered 2020-01-13: 25 mL

## 2020-01-13 MED ORDER — DEXAMETHASONE SODIUM PHOSPHATE 10 MG/ML IJ SOLN
INTRAMUSCULAR | Status: AC
  Filled 2020-01-13: qty 1

## 2020-01-13 MED ORDER — LACTATED RINGERS IV SOLN: INTRAVENOUS | Status: DC | PRN

## 2020-01-13 MED ORDER — ONDANSETRON HCL 4 MG/2ML IJ SOLN
4.0000 mg | Freq: Once | INTRAMUSCULAR | Status: AC
  Administered 2020-01-13: 4 mg via INTRAVENOUS
  Filled 2020-01-13: qty 2

## 2020-01-13 MED ORDER — BUPIVACAINE HCL 0.25 % IJ SOLN
INTRAMUSCULAR | Status: AC
  Filled 2020-01-13: qty 1

## 2020-01-13 MED ORDER — DEXAMETHASONE SODIUM PHOSPHATE 10 MG/ML IJ SOLN
INTRAMUSCULAR | Status: DC | PRN
  Administered 2020-01-13: 8 mg via INTRAVENOUS

## 2020-01-13 MED ORDER — ONDANSETRON HCL 4 MG/2ML IJ SOLN
INTRAMUSCULAR | Status: AC
  Filled 2020-01-13: qty 2

## 2020-01-13 MED ORDER — SUCCINYLCHOLINE CHLORIDE 200 MG/10ML IV SOSY
PREFILLED_SYRINGE | INTRAVENOUS | Status: AC
  Filled 2020-01-13: qty 10

## 2020-01-13 MED ORDER — FENTANYL CITRATE (PF) 250 MCG/5ML IJ SOLN
INTRAMUSCULAR | Status: AC
  Filled 2020-01-13: qty 5

## 2020-01-13 MED ORDER — HYDROMORPHONE HCL 1 MG/ML IJ SOLN: 1.0000 mg | INTRAMUSCULAR | Status: DC | PRN

## 2020-01-13 MED ORDER — ONDANSETRON HCL 4 MG/2ML IJ SOLN
INTRAMUSCULAR | Status: DC | PRN
  Administered 2020-01-13: 4 mg via INTRAVENOUS

## 2020-01-13 MED ORDER — ONDANSETRON HCL 4 MG/2ML IJ SOLN: 4.0000 mg | Freq: Four times a day (QID) | INTRAMUSCULAR | Status: DC | PRN

## 2020-01-13 MED ORDER — SODIUM CHLORIDE 0.9 % IV SOLN: 2.0000 g | INTRAVENOUS | Status: DC

## 2020-01-13 MED ORDER — PROPOFOL 10 MG/ML IV BOLUS
INTRAVENOUS | Status: DC | PRN
  Administered 2020-01-13: 150 mg via INTRAVENOUS

## 2020-01-13 MED ORDER — 0.9 % SODIUM CHLORIDE (POUR BTL) OPTIME
TOPICAL | Status: DC | PRN
  Administered 2020-01-13: 1000 mL

## 2020-01-13 MED ORDER — OXYCODONE HCL 5 MG/5ML PO SOLN: 5.0000 mg | Freq: Once | ORAL | Status: DC | PRN

## 2020-01-13 MED ORDER — IOHEXOL 300 MG/ML  SOLN
INTRAMUSCULAR | Status: DC | PRN
  Administered 2020-01-13: 5 mL

## 2020-01-13 MED ORDER — MIDAZOLAM HCL 5 MG/5ML IJ SOLN
INTRAMUSCULAR | Status: DC | PRN
  Administered 2020-01-13: 2 mg via INTRAVENOUS

## 2020-01-13 MED ORDER — MORPHINE SULFATE (PF) 4 MG/ML IV SOLN: 4.0000 mg | Freq: Once | INTRAVENOUS | Status: DC

## 2020-01-13 MED ORDER — LIDOCAINE 2% (20 MG/ML) 5 ML SYRINGE
INTRAMUSCULAR | Status: AC
  Filled 2020-01-13: qty 5

## 2020-01-13 MED ORDER — ACETAMINOPHEN 500 MG PO TABS: 1000.0000 mg | ORAL_TABLET | Freq: Four times a day (QID) | ORAL | 2 refills | Status: AC | PRN

## 2020-01-13 MED ORDER — SODIUM CHLORIDE 0.9 % IV BOLUS
500.0000 mL | Freq: Once | INTRAVENOUS | Status: AC
  Administered 2020-01-13: 500 mL via INTRAVENOUS

## 2020-01-13 MED ORDER — ONDANSETRON HCL 4 MG/2ML IJ SOLN: 4.0000 mg | Freq: Once | INTRAMUSCULAR | Status: DC | PRN

## 2020-01-13 MED ORDER — FENTANYL CITRATE (PF) 100 MCG/2ML IJ SOLN
INTRAMUSCULAR | Status: AC
  Filled 2020-01-13: qty 2

## 2020-01-13 MED ORDER — FENTANYL CITRATE (PF) 100 MCG/2ML IJ SOLN
INTRAMUSCULAR | Status: DC | PRN
  Administered 2020-01-13 (×3): 50 ug via INTRAVENOUS

## 2020-01-13 MED ORDER — SODIUM CHLORIDE 0.9% FLUSH: 3.0000 mL | Freq: Once | INTRAVENOUS | Status: DC

## 2020-01-13 MED ORDER — ROCURONIUM BROMIDE 10 MG/ML (PF) SYRINGE
PREFILLED_SYRINGE | INTRAVENOUS | Status: AC
  Filled 2020-01-13: qty 10

## 2020-01-13 MED ORDER — PHENYLEPHRINE 40 MCG/ML (10ML) SYRINGE FOR IV PUSH (FOR BLOOD PRESSURE SUPPORT)
PREFILLED_SYRINGE | INTRAVENOUS | Status: AC
  Filled 2020-01-13: qty 10

## 2020-01-13 MED ORDER — PROPOFOL 10 MG/ML IV BOLUS
INTRAVENOUS | Status: AC
  Filled 2020-01-13: qty 20

## 2020-01-13 MED ORDER — OXYCODONE HCL 5 MG PO TABS: 5.0000 mg | ORAL_TABLET | Freq: Once | ORAL | Status: DC | PRN

## 2020-01-13 SURGICAL SUPPLY — 42 items
APPLIER CLIP 5 13 M/L LIGAMAX5 (MISCELLANEOUS)
APPLIER CLIP ROT 10 11.4 M/L (STAPLE)
CABLE HIGH FREQUENCY MONO STRZ (ELECTRODE) ×3 IMPLANT
CHLORAPREP W/TINT 26 (MISCELLANEOUS) ×3 IMPLANT
CHOLANGIOGRAM CATH TAUT (CATHETERS) ×3 IMPLANT
CLIP APPLIE 5 13 M/L LIGAMAX5 (MISCELLANEOUS) IMPLANT
CLIP APPLIE ROT 10 11.4 M/L (STAPLE) IMPLANT
CLOSURE WOUND 1/4X4 (GAUZE/BANDAGES/DRESSINGS)
COVER MAYO STAND STRL (DRAPES) ×3 IMPLANT
COVER SURGICAL LIGHT HANDLE (MISCELLANEOUS) ×3 IMPLANT
COVER WAND RF STERILE (DRAPES) IMPLANT
DECANTER SPIKE VIAL GLASS SM (MISCELLANEOUS) ×3 IMPLANT
DERMABOND ADVANCED (GAUZE/BANDAGES/DRESSINGS) ×2
DERMABOND ADVANCED .7 DNX12 (GAUZE/BANDAGES/DRESSINGS) ×1 IMPLANT
DRAPE C-ARM 42X120 X-RAY (DRAPES) ×3 IMPLANT
ELECT REM PT RETURN 15FT ADLT (MISCELLANEOUS) ×3 IMPLANT
GLOVE SURG SYN 7.5  E (GLOVE) ×2
GLOVE SURG SYN 7.5 E (GLOVE) ×1 IMPLANT
GLOVE SURG SYN 7.5 PF PI (GLOVE) ×1 IMPLANT
GOWN STRL REUS W/TWL XL LVL3 (GOWN DISPOSABLE) ×9 IMPLANT
HEMOSTAT SURGICEL 4X8 (HEMOSTASIS) IMPLANT
IV CATH 14GX2 1/4 (CATHETERS) ×3 IMPLANT
IV SET EXTENSION CATH 6 NF (IV SETS) ×3 IMPLANT
KIT BASIN (CUSTOM PROCEDURE TRAY) ×3 IMPLANT
KIT TURNOVER KIT A (KITS) IMPLANT
PENCIL SMOKE EVACUATOR (MISCELLANEOUS) IMPLANT
POUCH RETRIEVAL ECOSAC 10 (ENDOMECHANICALS) ×1 IMPLANT
POUCH RETRIEVAL ECOSAC 10MM (ENDOMECHANICALS) ×2
SCISSORS LAP 5X35 DISP (ENDOMECHANICALS) ×3 IMPLANT
SET IRRIG TUBING LAPAROSCOPIC (IRRIGATION / IRRIGATOR) ×3 IMPLANT
SET TUBE SMOKE EVAC HIGH FLOW (TUBING) ×3 IMPLANT
SLEEVE ADV FIXATION 5X100MM (TROCAR) ×3 IMPLANT
STOPCOCK 4 WAY LG BORE MALE ST (IV SETS) ×3 IMPLANT
STRIP CLOSURE SKIN 1/4X4 (GAUZE/BANDAGES/DRESSINGS) IMPLANT
SUT MNCRL AB 4-0 PS2 18 (SUTURE) ×3 IMPLANT
SYR 10ML ECCENTRIC (SYRINGE) ×3 IMPLANT
TOWEL OR 17X26 10 PK STRL BLUE (TOWEL DISPOSABLE) ×3 IMPLANT
TOWEL OR NON WOVEN STRL DISP B (DISPOSABLE) ×3 IMPLANT
TRAY LAPAROSCOPIC (CUSTOM PROCEDURE TRAY) ×3 IMPLANT
TROCAR ADV FIXATION 11X100MM (TROCAR) IMPLANT
TROCAR ADV FIXATION 5X100MM (TROCAR) ×3 IMPLANT
TROCAR XCEL BLUNT TIP 100MML (ENDOMECHANICALS) ×3 IMPLANT

## 2020-01-13 NOTE — Anesthesia Preprocedure Evaluation (Addendum)
Anesthesia Evaluation  Patient identified by MRN, date of birth, ID band Patient awake    Reviewed: Allergy & Precautions, NPO status , Patient's Chart, lab work & pertinent test results  History of Anesthesia Complications Negative for: history of anesthetic complications  Airway Mallampati: I  TM Distance: >3 FB Neck ROM: Full    Dental  (+) Poor Dentition, Missing, Chipped   Pulmonary Current Smoker and Patient abstained from smoking.,    Pulmonary exam normal        Cardiovascular negative cardio ROS Normal cardiovascular exam     Neuro/Psych Depression negative neurological ROS     GI/Hepatic Neg liver ROS, cholelithiasis   Endo/Other  negative endocrine ROS  Renal/GU negative Renal ROS  negative genitourinary   Musculoskeletal negative musculoskeletal ROS (+)   Abdominal   Peds  Hematology negative hematology ROS (+)   Anesthesia Other Findings   Reproductive/Obstetrics                            Anesthesia Physical Anesthesia Plan  ASA: II  Anesthesia Plan: General   Post-op Pain Management:    Induction: Intravenous  PONV Risk Score and Plan: 2 and Ondansetron, Dexamethasone, Treatment may vary due to age or medical condition and Midazolam  Airway Management Planned: Oral ETT  Additional Equipment: None  Intra-op Plan:   Post-operative Plan: Extubation in OR  Informed Consent: I have reviewed the patients History and Physical, chart, labs and discussed the procedure including the risks, benefits and alternatives for the proposed anesthesia with the patient or authorized representative who has indicated his/her understanding and acceptance.     Dental advisory given  Plan Discussed with:   Anesthesia Plan Comments:         Anesthesia Quick Evaluation

## 2020-01-13 NOTE — Discharge Instructions (Signed)
[facility] LAPAROSCOPIC SURGERY: POST OP INSTRUCTIONS Always review your discharge instruction sheet given to you by the facility where your surgery was performed. IF YOU HAVE DISABILITY OR FAMILY LEAVE FORMS, YOU MUST BRING THEM TO THE OFFICE FOR PROCESSING.   DO NOT GIVE THEM TO YOUR DOCTOR.  PAIN CONTROL  1. First take acetaminophen (Tylenol) AND/or ibuprofen (Advil) to control your pain after surgery.  Follow directions on package.  Taking acetaminophen (Tylenol) and/or ibuprofen (Advil) regularly after surgery will help to control your pain and lower the amount of prescription pain medication you may need.  You should not take more than 3,000 mg (3 grams) of acetaminophen (Tylenol) in 24 hours.  You should not take ibuprofen (Advil), aleve, motrin, naprosyn or other NSAIDS if you have a history of stomach ulcers or chronic kidney disease.  2. A prescription for pain medication may be given to you upon discharge.  Take your pain medication as prescribed, if you still have uncontrolled pain after taking acetaminophen (Tylenol) or ibuprofen (Advil). 3. Use ice packs to help control pain. 4. If you need a refill on your pain medication, please contact your pharmacy.  They will contact our office to request authorization. Prescriptions will not be filled after 5pm or on week-ends.  HOME MEDICATIONS 5. Take your usually prescribed medications unless otherwise directed.  DIET 6. You should follow a light diet the first few days after arrival home.  Be sure to include lots of fluids daily. Avoid fatty, fried foods.   CONSTIPATION 7. It is common to experience some constipation after surgery and if you are taking pain medication.  Increasing fluid intake and taking a stool softener (such as Colace) will usually help or prevent this problem from occurring.  A mild laxative (Milk of Magnesia or Miralax) should be taken according to package instructions if there are no bowel  movements after 48 hours.  WOUND/INCISION CARE 8. Most patients will experience some swelling and bruising in the area of the incisions.  Ice packs will help.  Swelling and bruising can take several days to resolve.  9. Unless discharge instructions indicate otherwise, follow guidelines below  a. STERI-STRIPS - you may remove your outer bandages 48 hours after surgery, and you may shower at that time.  You have steri-strips (small skin tapes) in place directly over the incision.  These strips should be left on the skin for 7-10 days.   b. DERMABOND/SKIN GLUE - you may shower in 24 hours.  The glue will flake off over the next 2-3 weeks. 10. Any sutures or staples will be removed at the office during your follow-up visit.  ACTIVITIES 11. You may resume regular (light) daily activities beginning the next day--such as daily self-care, walking, climbing stairs--gradually increasing activities as tolerated.  You may have sexual intercourse when it is comfortable.  Refrain from any heavy lifting or straining until approved by your doctor. a. You may drive when you are no longer taking prescription pain medication, you can comfortably wear a seatbelt, and you can safely maneuver your car and apply brakes.  FOLLOW-UP 12. You should see your doctor in the office for a follow-up appointment approximately 2-3 weeks after your surgery.  You should have been given your post-op/follow-up appointment when your surgery was scheduled.  If you did not receive a post-op/follow-up appointment, make sure that you call for this appointment within a day or two after you arrive home to insure a convenient appointment time.     WHEN TO CALL YOUR DOCTOR: 1. Fever over 101.0 2. Inability to urinate 3. Continued bleeding from incision. 4. Increased pain, redness, or drainage from the incision. 5. Increasing abdominal pain  The clinic staff is available to answer your questions during regular business hours.  Please don't  hesitate to call and ask to speak to one of the nurses for clinical concerns.  If you have a medical emergency, go to the nearest emergency room or call 911.  A surgeon from [facility] is always on call at the hospital. [address], [address], [place], [place]  [address] ? [address] [address], [place], [place]   [address] [phone] ? [phone] ? FAX [phone] Web site: [email or web address]  .........   Managing Your Pain After Surgery Without Opioids    Thank you for participating in our program to help patients manage their pain after surgery without opioids. This is part of our effort to provide you with the best care possible, without exposing you or your family to the risk that opioids pose.  What pain can I expect after surgery? You can expect to have some pain after surgery. This is normal. The pain is typically worse the day after surgery, and quickly begins to get better. Many studies have found that many patients are able to manage their pain after surgery with Over-the-Counter (OTC) medications such as Tylenol and Motrin. If you have a condition that does not allow you to take Tylenol or Motrin, notify your surgical team.  How will I manage my pain? The best strategy for controlling your pain after surgery is around the clock pain control with Tylenol (acetaminophen) and Motrin (ibuprofen or Advil). Alternating these medications with each other allows you to maximize your pain control. In addition to Tylenol and Motrin, you can use heating pads or ice packs on your incisions to help reduce your pain.  How will I alternate your regular strength over-the-counter pain medication? You will take a dose of pain medication every three hours. ; Start by taking 650 mg of Tylenol (2 pills of 325 mg) ; 3 hours later take 600 mg of Motrin (3 pills of 200 mg) ; 3 hours after taking the Motrin take 650 mg of Tylenol ; 3 hours after that take 600 mg of  Motrin.   - 1 -  See example - if your first dose of Tylenol is at 12:00 PM   12:00 PM Tylenol 650 mg (2 pills of 325 mg)  3:00 PM Motrin 600 mg (3 pills of 200 mg)  6:00 PM Tylenol 650 mg (2 pills of 325 mg)  9:00 PM Motrin 600 mg (3 pills of 200 mg)  Continue alternating every 3 hours   We recommend that you follow this schedule around-the-clock for at least 3 days after surgery, or until you feel that it is no longer needed. Use the table on the last page of this handout to keep track of the medications you are taking. Important: Do not take more than 3000mg of Tylenol or 3200mg of Motrin in a 24-hour period. Do not take ibuprofen/Motrin if you have a history of bleeding stomach ulcers, severe kidney disease, &/or actively taking a blood thinner  What if I still have pain? If you have pain that is not controlled with the over-the-counter pain medications (Tylenol and Motrin or Advil) you might have what we call "breakthrough" pain. You will receive a prescription for a small amount of an opioid pain medication such as   Oxycodone, Tramadol, or Tylenol with Codeine. Use these opioid pills in the first 24 hours after surgery if you have breakthrough pain. Do not take more than 1 pill every 4-6 hours.  If you still have uncontrolled pain after using all opioid pills, don't hesitate to call our staff using the number provided. We will help make sure you are managing your pain in the best way possible, and if necessary, we can provide a prescription for additional pain medication.   Day 1    Time  Name of Medication Number of pills taken  Amount of Acetaminophen  Pain Level   Comments  AM PM       AM PM       AM PM       AM PM       AM PM       AM PM       AM PM       AM PM       Total Daily amount of Acetaminophen Do not take more than  3,000 mg per day      Day 2    Time  Name of Medication Number of pills taken  Amount of Acetaminophen  Pain Level   Comments  AM  PM       AM PM       AM PM       AM PM       AM PM       AM PM       AM PM       AM PM       Total Daily amount of Acetaminophen Do not take more than  3,000 mg per day      Day 3    Time  Name of Medication Number of pills taken  Amount of Acetaminophen  Pain Level   Comments  AM PM       AM PM       AM PM       AM PM          AM PM       AM PM       AM PM       AM PM       Total Daily amount of Acetaminophen Do not take more than  3,000 mg per day      Day 4    Time  Name of Medication Number of pills taken  Amount of Acetaminophen  Pain Level   Comments  AM PM       AM PM       AM PM       AM PM       AM PM       AM PM       AM PM       AM PM       Total Daily amount of Acetaminophen Do not take more than  3,000 mg per day      Day 5    Time  Name of Medication Number of pills taken  Amount of Acetaminophen  Pain Level   Comments  AM PM       AM PM       AM PM       AM PM       AM PM       AM PM       AM PM         AM PM       Total Daily amount of Acetaminophen Do not take more than  3,000 mg per day       Day 6    Time  Name of Medication Number of pills taken  Amount of Acetaminophen  Pain Level  Comments  AM PM       AM PM       AM PM       AM PM       AM PM       AM PM       AM PM       AM PM       Total Daily amount of Acetaminophen Do not take more than  3,000 mg per day      Day 7    Time  Name of Medication Number of pills taken  Amount of Acetaminophen  Pain Level   Comments  AM PM       AM PM       AM PM       AM PM       AM PM       AM PM       AM PM       AM PM       Total Daily amount of Acetaminophen Do not take more than  3,000 mg per day        For additional information about how and where to safely dispose of unused opioid medications - https://www.morepowerful[employer].org  Disclaimer: This document contains information and/or instructional materials adapted from [facility]  for the typical patient with your condition. It does not replace medical advice from your health care provider because your experience may differ from that of the typical patient. Talk to your health care provider if you have any questions about this document, your condition or your treatment plan. Adapted from [facility]  

## 2020-01-13 NOTE — Transfer of Care (Signed)
Immediate Anesthesia Transfer of Care Note  Patient: Walter Carr.  Procedure(s) Performed: LAPAROSCOPIC CHOLECYSTECTOMY WITH INTRAOPERATIVE CHOLANGIOGRAM (N/A )  Patient Location: PACU  Anesthesia Type:General  Level of Consciousness: drowsy and patient cooperative  Airway & Oxygen Therapy: Patient Spontanous Breathing and Patient connected to face mask oxygen  Post-op Assessment: Report given to RN and Post -op Vital signs reviewed and stable  Post vital signs: Reviewed and stable  Last Vitals:  Vitals Value Taken Time  BP 139/89 01/13/20 1310  Temp    Pulse 70 01/13/20 1314  Resp 14 01/13/20 1314  SpO2 100 % 01/13/20 1314  Vitals shown include unvalidated device data.  Last Pain:  Vitals:   01/13/20 1000  TempSrc: Oral  PainSc: 6          Complications: No apparent anesthesia complications

## 2020-01-13 NOTE — Op Note (Signed)
01/13/2020  12:55 PM  PATIENT:  Walter Knuckles., 54 y.o., male, MRN: [id number]  PREOP DIAGNOSIS:  cholelithiasis  POSTOP DIAGNOSIS:   Acute edematous cholecystitis, cholelithiasis  PROCEDURE:   Procedure(s): LAPAROSCOPIC CHOLECYSTECTOMY WITH INTRAOPERATIVE CHOLANGIOGRAM  SURGEON:   Ovidio Kin, M.D.  ASSISTANT:   Barnetta Chapel, PA  ANESTHESIA:   general  Anesthesiologist: Lucretia Kern, MD CRNA: Epimenio Sarin, CRNA  General  ASA: 2 Emergency  EBL:  minimal  ml  BLOOD ADMINISTERED: none  DRAINS: none   LOCAL MEDICATIONS USED:   25 cc 1/4 % marcaine  SPECIMEN:   Gall bladder  COUNTS CORRECT:  YES  INDICATIONS FOR PROCEDURE:  Walter Carr. is a 54 y.o. (DOB: 11/21/1965) white male whose primary care physician is Patient, No Pcp Per and comes for cholecystectomy.   The indications and risks of the gall bladder surgery were explained to the patient.  The risks include, but are not limited to, infection, bleeding, common bile duct injury and open surgery.  SURGERY:  The patient was taken to OR room #4 at [facility].  The abdomen was prepped with chloroprep.  The patient was given Rocephin prior to the beginning of the operation.   A time out was held and the surgical checklist run.   An infraumbilical incision was made into the abdominal cavity.  A 12 mm Hasson trocar was inserted into the abdominal cavity through the infraumbilical incision and secured with a 0 Vicryl suture.  Three additional trocars were inserted: a 5 mm trocar in the sub-xiphoid location, a 5 mm trocar in the right mid subcostal area, and a 5 mm trocar in the right lateral subcostal area.   The abdomen was explored and the liver, stomach, and bowel that could be seen were unremarkable.   The gall bladder was distended.  There was an approximate 2.0 cm gall stone impacted in the neck of the gall bladder.   I grasped the gall bladder and rotated it cephalad.  Disssection was carried  down to the gall bladder/cystic duct junction and the cystic duct isolated.  A clip was placed on the gall bladder side of the cystic duct.   An intra-operative cholangiogram was shot.   The intra-operative cholangiogram was shot using a cut off Taut catheter placed through a 14 gauge angiocath in the RUQ.  The Taut catheter was inserted in the cut cystic duct and secured with an endoclip.  A cholangiogram was shot with 8 cc of 1/2 strength Isoview.  Using fluoroscopy, the cholangiogram showed the flow of contrast into the common bile duct, up the hepatic radicals, and into the duodenum.  There was no mass or obstruction.  This was a normal intra-operative cholangiogram.   The Taut catheter was removed.  The cystic duct was tripley endoclipped and the cystic artery was identified and clipped.  The gall bladder was bluntly and sharpley dissected from the gall bladder bed.   After the gall bladder was removed from the liver, the gall bladder bed and Triangle of Calot were inspected.  There was no bleeding or bile leak.  The gall bladder was placed in a Ecco Sac bag and delivered through the umbilicus.  The abdomen was irrigated with 800 cc saline.   The trocars were then removed.  I infiltrated 25cc of 1/4% Marcaine into the incisions.  The umbilical port closed with a 0 Vicryl suture and the skin closed with 4-0 Monocryl.  The skin was painted with DermaBond.  The patient's sponge and needle count were correct.  The patient was transported to the RR in good condition.  Walter Overall, MD, [facility] Surgery Pager: [phone] Office phone:  [phone]

## 2020-01-13 NOTE — ED Provider Notes (Signed)
[facility]-EMERGENCY DEPT Provider Note   CSN: [id number] Arrival date & time: 01/13/20  0048     History Chief Complaint  Patient presents with  . Abdominal Pain    Walter Carr. is a 54 y.o. male with a hx of depression, gallstones presents to the Emergency Department complaining of gradual, persistent, progressively worsening upper quadrant abdominal pain with associated nausea and vomiting onset 830 last night.  Patient reports he has had intermittent right upper quadrant abdominal pain over the last several months.  He was diagnosed with gallstones recently and saw general surgery in the outpatient setting 1 month ago (does not know who he saw).  He was told that he needed his gallbladder removed but has been unable to do so.  He reports worsening pain over the last month and within the last 2 weeks anything he eats causes severe pain and intermittent vomiting.  He reports that last night he was unable to hold anything down and his pain became more severe.  He denies fevers or chills, weakness, dizziness, syncope, dysuria.  Nothing seems to make his symptoms better.  The history is provided by the patient and medical records. No language interpreter was used.       Past Medical History:  Diagnosis Date  . Depression   . Gallstones     There are no problems to display for this patient.   History reviewed. No pertinent surgical history.     No family history on file.  Social History   Tobacco Use  . Smoking status: Current Every Day Smoker  . Smokeless tobacco: Never Used  Substance Use Topics  . Alcohol use: Not Currently  . Drug use: Not Currently    Home Medications Prior to Admission medications   Medication Sig Start Date End Date Taking? Authorizing Provider  HYDROcodone-acetaminophen (NORCO/VICODIN) 5-325 MG tablet Take 1-2 tablets by mouth every 6 (six) hours as needed. 12/22/19   Maxwell Caul, PA-C  ondansetron (ZOFRAN ODT) 4  MG disintegrating tablet Take 1 tablet (4 mg total) by mouth every 8 (eight) hours as needed for nausea or vomiting. 12/22/19   Maxwell Caul, PA-C  ondansetron (ZOFRAN) 4 MG tablet Take 1 tablet (4 mg total) by mouth every 6 (six) hours. 02/10/19   Wynetta Fines, MD  QUEtiapine Fumarate (SEROQUEL XR) 150 MG 24 hr tablet Take 150 mg by mouth at bedtime. 12/10/18   [provider]    Allergies    Fish allergy and Elavil [amitriptyline hcl]  Review of Systems   Review of Systems  Constitutional: Negative for appetite change, diaphoresis, fatigue, fever and unexpected weight change.  HENT: Negative for mouth sores.   Eyes: Negative for visual disturbance.  Respiratory: Negative for cough, chest tightness, shortness of breath and wheezing.   Cardiovascular: Negative for chest pain.  Gastrointestinal: Positive for abdominal pain, nausea and vomiting. Negative for constipation and diarrhea.  Endocrine: Negative for polydipsia, polyphagia and polyuria.  Genitourinary: Negative for dysuria, frequency, hematuria and urgency.  Musculoskeletal: Negative for back pain and neck stiffness.  Skin: Negative for rash.  Allergic/Immunologic: Negative for immunocompromised state.  Neurological: Negative for syncope, light-headedness and headaches.  Hematological: Does not bruise/bleed easily.  Psychiatric/Behavioral: Negative for sleep disturbance. The patient is not nervous/anxious.     Physical Exam Updated Vital Signs BP 114/63   Pulse (!) 53   Temp 98 F (36.7 C) (Oral)   Resp 18   SpO2 98%   Physical Exam  Vitals and nursing note reviewed.  Constitutional:      General: He is not in acute distress.    Appearance: He is not diaphoretic.  HENT:     Head: Normocephalic.  Eyes:     General: No scleral icterus.    Conjunctiva/sclera: Conjunctivae normal.  Cardiovascular:     Rate and Rhythm: Normal rate and regular rhythm.     Pulses: Normal pulses.          Radial pulses are  2+ on the right side and 2+ on the left side.  Pulmonary:     Effort: No tachypnea, accessory muscle usage, prolonged expiration, respiratory distress or retractions.     Breath sounds: No stridor.     Comments: Equal chest rise. No increased work of breathing. Abdominal:     General: There is no distension.     Palpations: Abdomen is soft.     Tenderness: There is abdominal tenderness in the right upper quadrant. There is guarding and rebound. There is no right CVA tenderness or left CVA tenderness.  Musculoskeletal:     Cervical back: Normal range of motion.     Comments: Moves all extremities equally and without difficulty.  Skin:    General: Skin is warm and dry.     Capillary Refill: Capillary refill takes less than 2 seconds.  Neurological:     Mental Status: He is alert.     GCS: GCS eye subscore is 4. GCS verbal subscore is 5. GCS motor subscore is 6.     Comments: Speech is clear and goal oriented.  Psychiatric:        Mood and Affect: Mood normal.     ED Results / Procedures / Treatments   Labs (all labs ordered are listed, but only abnormal results are displayed) Labs Reviewed  COMPREHENSIVE METABOLIC PANEL - Abnormal; Notable for the following components:      Result Value   Glucose, Bld 134 (*)    Calcium 8.7 (*)    All other components within normal limits  CBC - Abnormal; Notable for the following components:   WBC 18.5 (*)    All other components within normal limits  SARS CORONAVIRUS 2 BY RT PCR (HOSPITAL ORDER, [place] [facility])  LIPASE, BLOOD  URINALYSIS, ROUTINE W REFLEX MICROSCOPIC     Radiology US Abdomen Limited  Result Date: 01/13/2020 CLINICAL DATA:  Right upper quadrant pain EXAM: ULTRASOUND ABDOMEN LIMITED RIGHT UPPER QUADRANT COMPARISON:  Ultrasound 12/22/2019 FINDINGS: Gallbladder: Gallbladder contains some hypoechoic biliary sludge with additional punctate echogenic foci which may reflect small punctate gallstones.  Several larger shadowing gallstones are present as well including a larger 2 cm calculus in the gallbladder body and a 1.7 cm gallstone near the gallbladder neck. No pericholecystic fluid or gallbladder wall thickening. Sonographic Percell Miller sign is reportedly negative. Common bile duct: Diameter: 3.2 mm, nondilated Liver: No focal lesion identified. Within normal limits in parenchymal echogenicity. Portal vein is patent on color Doppler imaging with normal direction of blood flow towards the liver. Other: None. IMPRESSION: Cholelithiasis and biliary sludge including several larger gallstones including a 1.7 cm stone at the gallbladder neck. No convincing sonographic evidence of acute cholecystitis nor biliary ductal dilatation is seen however. Electronically Signed   By: Lovena Le M.D.   On: 01/13/2020 05:33    Procedures Procedures (including critical care time)  Medications Ordered in ED Medications  sodium chloride flush (NS) 0.9 % injection 3 mL (3 mLs Intravenous Not  Given 01/13/20 0524)  fentaNYL (SUBLIMAZE) injection 50 mcg (has no administration in time range)  sodium chloride 0.9 % bolus 500 mL (500 mLs Intravenous New Bag/Given 01/13/20 0522)  ondansetron (ZOFRAN) injection 4 mg (4 mg Intravenous Given 01/13/20 0533)  fentaNYL (SUBLIMAZE) injection 50 mcg (50 mcg Intravenous Given 01/13/20 0533)    ED Course  I have reviewed the triage vital signs and the nursing notes.  Pertinent labs & imaging results that were available during my care of the patient were reviewed by me and considered in my medical decision making (see chart for details).  Clinical Course as of Jan 12 642  Mon Jan 13, 2020  3149 Patient continues to have significant pain.  Will give additional pain medication.  He remains tender on exam.  Will admit.   [HM]  [phone] Dr. Bebe Shaggy discussed with Dr. Gerrit Friends who will admit.    [HM]    Clinical Course User Index [HM] Draven Natter, Boyd Kerbs   MDM  Rules/Calculators/A&P                       Patient presents to the emergency department with right upper quadrant abdominal pain and history of cholelithiasis.  Vomiting on arrival and in severe pain.  Labs with significant leukocytosis.  Patient afebrile.  Positive Murphy sign on exam.  Patient given Zofran and fentanyl without significant relief.  Ultrasound shows cholelithiasis without specific cholecystitis however he does have a stone at the neck of the gallbladder.  Difficult to control symptoms throughout his stay here in the emergency department.  Will admit to surgery for treatment and likely surgery.  The patient was discussed with Dr. Bebe Shaggy who agrees with the treatment plan.   Final Clinical Impression(s) / ED Diagnoses Final diagnoses:  RUQ abdominal pain  Calculus of gallbladder without cholecystitis without obstruction  Non-intractable vomiting with nausea, unspecified vomiting type  Intractable pain    Rx / DC Orders ED Discharge Orders    None       Milta Deiters 01/13/20 3785    Zadie Rhine, MD 01/13/20 [phone]

## 2020-01-13 NOTE — Anesthesia Postprocedure Evaluation (Signed)
Anesthesia Post Note  Patient: Walter Carr.  Procedure(s) Performed: LAPAROSCOPIC CHOLECYSTECTOMY WITH INTRAOPERATIVE CHOLANGIOGRAM (N/A )     Patient location during evaluation: PACU Anesthesia Type: General Level of consciousness: awake and alert, oriented and patient cooperative Pain management: pain level controlled Vital Signs Assessment: post-procedure vital signs reviewed and stable Respiratory status: spontaneous breathing, nonlabored ventilation and respiratory function stable Cardiovascular status: blood pressure returned to baseline and stable Postop Assessment: no apparent nausea or vomiting Anesthetic complications: no    Last Vitals:  Vitals:   01/13/20 1345 01/13/20 1400  BP: 108/71   Pulse: (!) 56 (!) 59  Resp: 12   Temp:    SpO2: 96% 99%    Last Pain:  Vitals:   01/13/20 1338  TempSrc:   PainSc: 7                  Lannie Fields

## 2020-01-13 NOTE — ED Triage Notes (Signed)
RUQ abdominal pain and vomiting for several hours. Pt says "everything I eat bothers it". Says he has been to see surgeon for the same, has not scheduled surgery. Denies fevers.

## 2020-01-13 NOTE — Anesthesia Procedure Notes (Signed)
Procedure Name: Intubation Date/Time: 01/13/2020 11:52 AM Performed by: Montel Clock, CRNA Pre-anesthesia Checklist: Patient identified, Emergency Drugs available, Suction available, Patient being monitored and Timeout performed Patient Re-evaluated:Patient Re-evaluated prior to induction Oxygen Delivery Method: Circle system utilized Preoxygenation: Pre-oxygenation with 100% oxygen Induction Type: IV induction and Rapid sequence Laryngoscope Size: Mac and 3 Grade View: Grade I Tube type: Oral Tube size: 7.5 mm Number of attempts: 1 Airway Equipment and Method: Stylet Placement Confirmation: ETT inserted through vocal cords under direct vision,  positive ETCO2 and breath sounds checked- equal and bilateral Secured at: 23 cm Tube secured with: Tape Dental Injury: Teeth and Oropharynx as per pre-operative assessment  Comments: Intubation by paramedic student.

## 2020-01-13 NOTE — ED Notes (Addendum)
Paper consent form at bedside and filled out. Waiting for surgeon to discuss operation with patient. Need signature from both.   CHG bath wipes at bedside.  Belongings bag at bedside.

## 2020-01-13 NOTE — H&P (Addendum)
[facility] H&P Note  Walter Carr. Oct 14, 1965  [id number].    Requesting MD: Bebe Shaggy Chief Complaint/Reason for Consult: RUQ pain   HPI:  Patient is a 54 year old male who presented to [facility] with abdominal pain that began around 8:30 PM last night. Patient reports pain is in the right abdomen right under his ribs. He denies radiation of pain. Pain is sharp in nature. Associated nausea and vomiting. He denies fever, chills, chest pain, SOB, diarrhea, urinary symptoms, rash, sick contacts. He has had similar symptoms in the past and was seen almost a year ago in the office by Dr. Luisa Hart. Surgery was planned but cancelled due to no insurance and upfront cost being too much for patient. He feels like his symptoms have worsened over time and are now occurring almost every time he eats anything. No reported PMH, he takes seroquel at night for sleep but no other medications. Allergic to elavil. No past abdominal surgery. He denies alcohol or illicit drug use. He smokes 1/4 to 1/2 ppd. He works remodeling homes. Has not been vaccinated for COVID-19 but has not had any known exposures.   ROS: Review of Systems  Constitutional: Negative for chills and fever.  Respiratory: Negative for shortness of breath and wheezing.   Cardiovascular: Negative for chest pain and palpitations.  Gastrointestinal: Positive for abdominal pain, nausea and vomiting. Negative for constipation and diarrhea.  Genitourinary: Negative for dysuria, frequency and urgency.  Musculoskeletal: Negative for back pain and myalgias.  Skin: Negative for rash.  All other systems reviewed and are negative.   No family history on file.  Past Medical History:  Diagnosis Date  . Depression   . Gallstones     History reviewed. No pertinent surgical history.  Social History:  reports that he has been smoking. He has never used smokeless tobacco. He reports previous alcohol use. He reports previous drug  use.  Allergies:  Allergies  Allergen Reactions  . Fish Allergy Nausea And Vomiting  . Elavil [Amitriptyline Hcl] Palpitations    (Not in a hospital admission)   Blood pressure 117/77, pulse 85, temperature 98 F (36.7 C), temperature source Oral, resp. rate 18, SpO2 98 %. Physical Exam:  General: pleasant, WD, thin white male who is laying in bed in NAD HEENT:  Sclera are anicteric.  PERRL.  Ears and nose without any masses or lesions.  Mouth is pink and moist Heart: regular, rate, and rhythm.  Normal s1,s2. No obvious murmurs, gallops, or rubs noted.  Palpable radial and pedal pulses bilaterally Lungs: CTAB, no wheezes, rhonchi, or rales noted.  Respiratory effort nonlabored Abd: soft, ttp in RUQ, no peritonitis, no guarding, ND, +BS, no masses, hernias, or organomegaly MS: all 4 extremities are symmetrical with no cyanosis, clubbing, or edema. Skin: warm and dry with no masses, lesions, or rashes Neuro: Cranial nerves 2-12 grossly intact, sensation grossly intact throughout Psych: A&Ox3 with an appropriate affect.   Results for orders placed or performed during the hospital encounter of 01/13/20 (from the past 48 hour(s))  Lipase, blood     Status: None   Collection Time: 01/13/20  3:33 AM  Result Value Ref Range   Lipase 21 11 - 51 U/L    Comment: Performed at [facility], [address], [place], [place] [address]  Comprehensive metabolic panel     Status: Abnormal   Collection Time: 01/13/20  3:33 AM  Result Value Ref Range   Sodium 139 135 - 145 mmol/L  Potassium 3.9 3.5 - 5.1 mmol/L   Chloride 103 98 - 111 mmol/L   CO2 26 22 - 32 mmol/L   Glucose, Bld 134 (H) 70 - 99 mg/dL    Comment: Glucose reference range applies only to samples taken after fasting for at least 8 hours.   BUN 11 6 - 20 mg/dL   Creatinine, Ser 0.82 0.61 - 1.24 mg/dL   Calcium 8.7 (L) 8.9 - 10.3 mg/dL   Total Protein 6.7 6.5 - 8.1 g/dL   Albumin 4.2 3.5 - 5.0 g/dL   AST 19 15  - 41 U/L   ALT 21 0 - 44 U/L   Alkaline Phosphatase 46 38 - 126 U/L   Total Bilirubin 0.4 0.3 - 1.2 mg/dL   GFR calc non Af Amer >60 >60 mL/min   GFR calc Af Amer >60 >60 mL/min   Anion gap 10 5 - 15    Comment: Performed at [facility], [place] [address], [place], [place] [address]  CBC     Status: Abnormal   Collection Time: 01/13/20  3:33 AM  Result Value Ref Range   WBC 18.5 (H) 4.0 - 10.5 K/uL   RBC 4.61 4.22 - 5.81 MIL/uL   Hemoglobin 15.1 13.0 - 17.0 g/dL   HCT 43.9 39.0 - 52.0 %   MCV 95.2 80.0 - 100.0 fL   MCH 32.8 26.0 - 34.0 pg   MCHC 34.4 30.0 - 36.0 g/dL   RDW 12.1 11.5 - 15.5 %   Platelets 278 150 - 400 K/uL   nRBC 0.0 0.0 - 0.2 %    Comment: Performed at [facility], [place] [address], [place], [place] [address]  Urinalysis, Routine w reflex microscopic     Status: None   Collection Time: 01/13/20  4:38 AM  Result Value Ref Range   Color, Urine YELLOW YELLOW   APPearance CLEAR CLEAR   Specific Gravity, Urine 1.020 1.005 - 1.030   pH 6.0 5.0 - 8.0   Glucose, UA NEGATIVE NEGATIVE mg/dL   Hgb urine dipstick NEGATIVE NEGATIVE   Bilirubin Urine NEGATIVE NEGATIVE   Ketones, ur NEGATIVE NEGATIVE mg/dL   Protein, ur NEGATIVE NEGATIVE mg/dL   Nitrite NEGATIVE NEGATIVE   Leukocytes,Ua NEGATIVE NEGATIVE    Comment: Performed at [facility] [address], [place], [place] [address]   US Abdomen Limited  Result Date: 01/13/2020 CLINICAL DATA:  Right upper quadrant pain EXAM: ULTRASOUND ABDOMEN LIMITED RIGHT UPPER QUADRANT COMPARISON:  Ultrasound 12/22/2019 FINDINGS: Gallbladder: Gallbladder contains some hypoechoic biliary sludge with additional punctate echogenic foci which may reflect small punctate gallstones. Several larger shadowing gallstones are present as well including a larger 2 cm calculus in the gallbladder body and a 1.7 cm gallstone near the gallbladder neck. No pericholecystic fluid or gallbladder wall  thickening. Sonographic Percell Miller sign is reportedly negative. Common bile duct: Diameter: 3.2 mm, nondilated Liver: No focal lesion identified. Within normal limits in parenchymal echogenicity. Portal vein is patent on color Doppler imaging with normal direction of blood flow towards the liver. Other: None. IMPRESSION: Cholelithiasis and biliary sludge including several larger gallstones including a 1.7 cm stone at the gallbladder neck. No convincing sonographic evidence of acute cholecystitis nor biliary ductal dilatation is seen however. Electronically Signed   By: Lovena Le M.D.   On: 01/13/2020 05:33      Assessment/Plan Hx of depression - takes seroquel nightly  Acute cholecystitis - US shows large stone impacted in the gallbladder  neck - WBC 18, pt afebrile - LFTs WNL and Tbili WNL - to OR for laparoscopic cholecystectomy  - covid test pending   May be able to discharge from PACU post-op, if not will admit for observation.   Juliet Rude, [facility] Surgery 01/13/2020, 8:11 AM Please see Amion for pager number during day hours 7:00am-4:30pm  Agree with above. He works remodeling. He has a roommate "Erica".  I discussed with the patient the indications and risks of gall bladder surgery.  The primary risks of gall bladder surgery include, but are not limited to, bleeding, infection, common bile duct injury, and open surgery.  There is also the risk that the patient may have continued symptoms after surgery.  We discussed the typical post-operative recovery course. I tried to answer the patient's questions.  Ovidio Kin, MD, [facility] Surgery Office phone:  [phone]

## 2020-01-13 NOTE — ED Triage Notes (Signed)
Pt arrives via [facility] from home with reported by medic pt c/o abdominal pain d/t gallstones. Seen recently for the same. Abdominal pain after eating. Vomiting. RUQ pain. 136/76, hr 98, rr 16, spo2 99 ra, temp 98.7

## 2020-01-14 LAB — SURGICAL PATHOLOGY

## 2020-03-07 IMAGING — US ULTRASOUND ABDOMEN LIMITED
1 series · 14 of 25 positions shown · non-contrast
Comparison: None.

CLINICAL DATA: Right upper quadrant pain since this morning.

EXAM:
ULTRASOUND ABDOMEN LIMITED RIGHT UPPER QUADRANT

[Series 1: ultrasound abdomen limited · 14 of 61 slices shown]
[im 1/61]
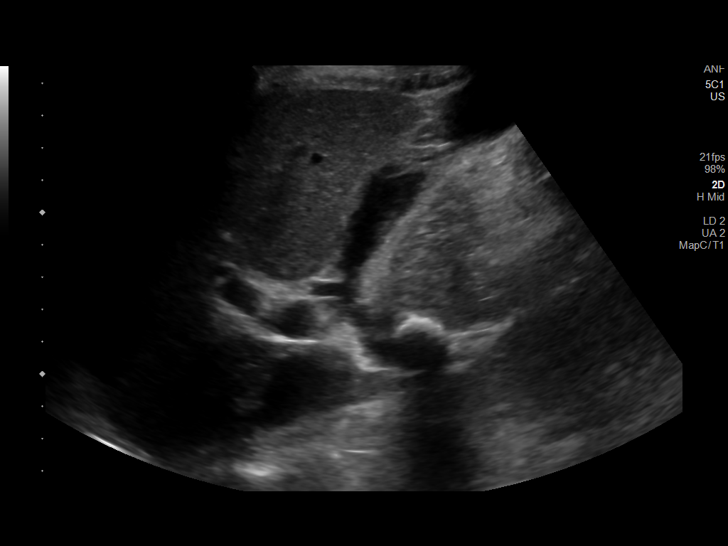
[im 6/61]
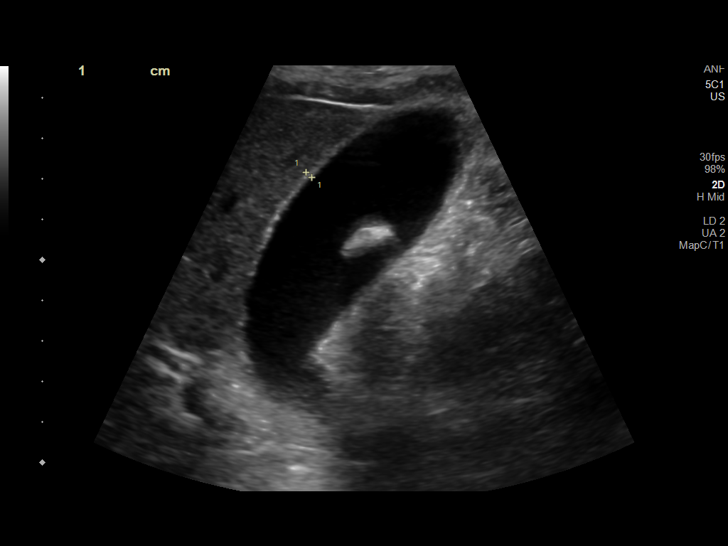
[im 11/61]
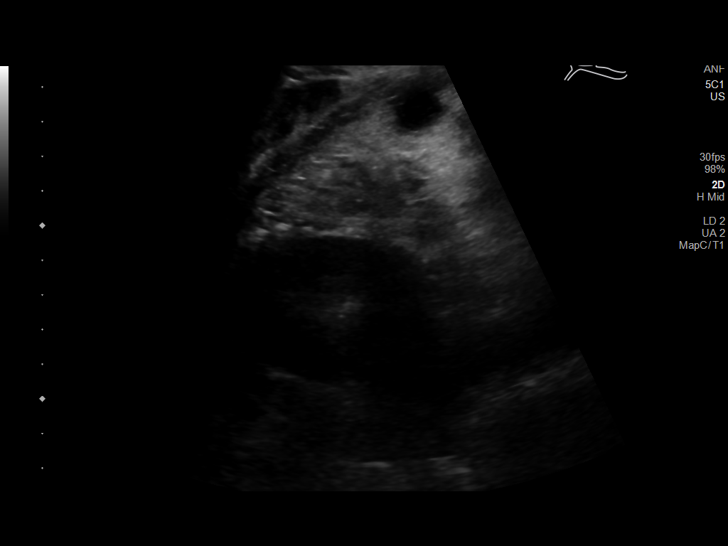
[im 16/61]
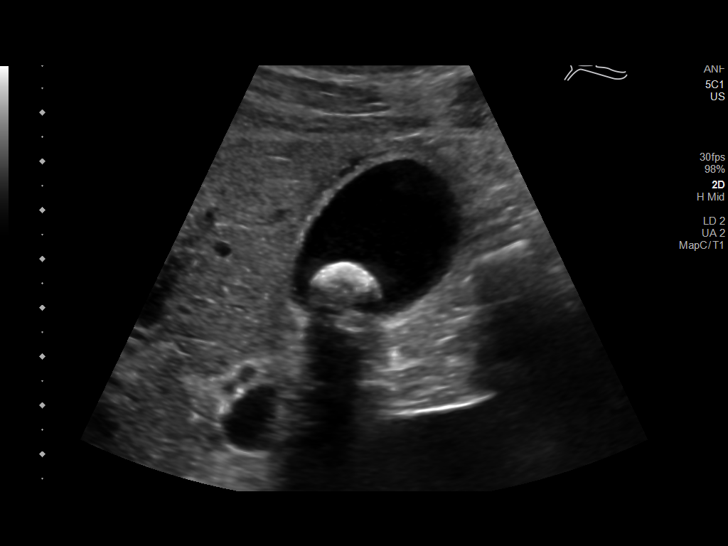
[im 21/61]
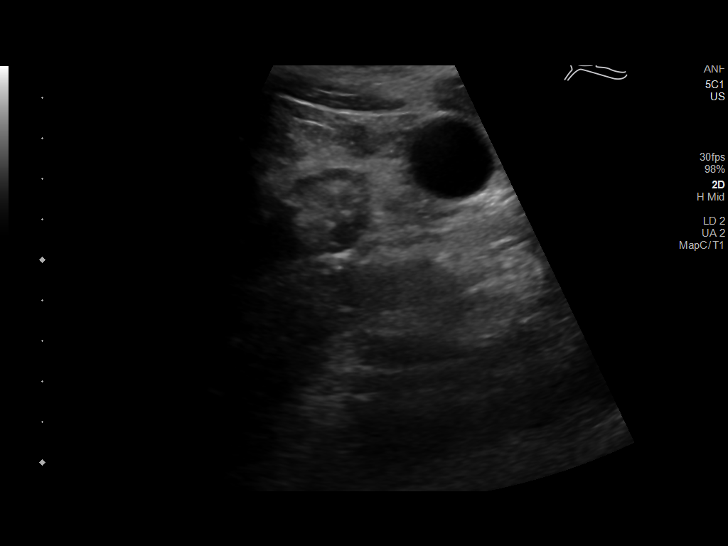
[im 23/61]
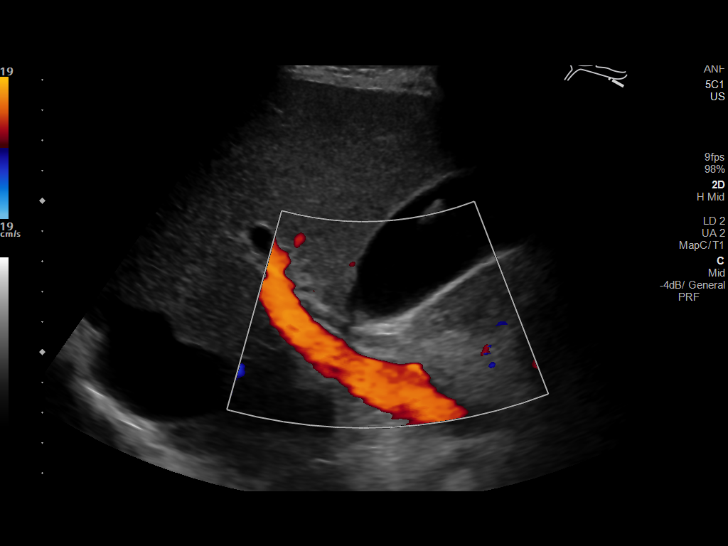
[im 28/61]
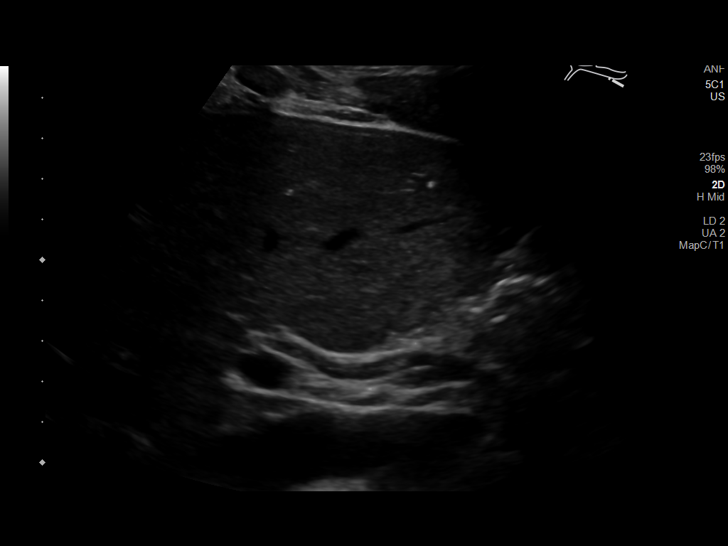
[im 33/61]
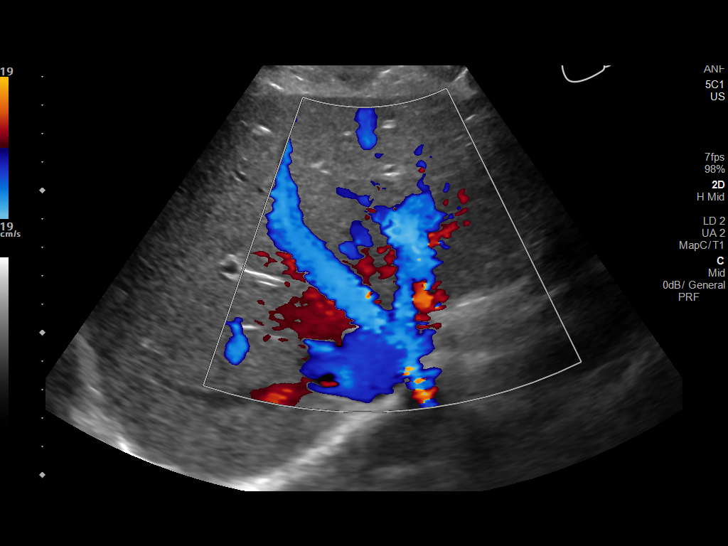
[im 38/61]
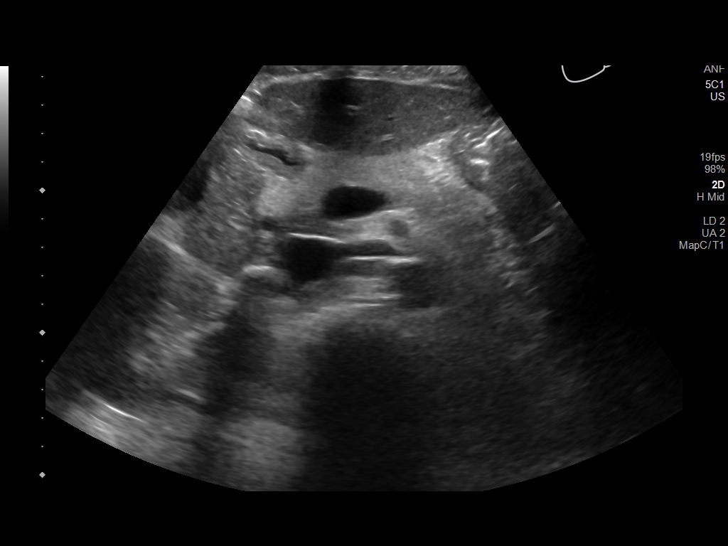
[im 41/61]
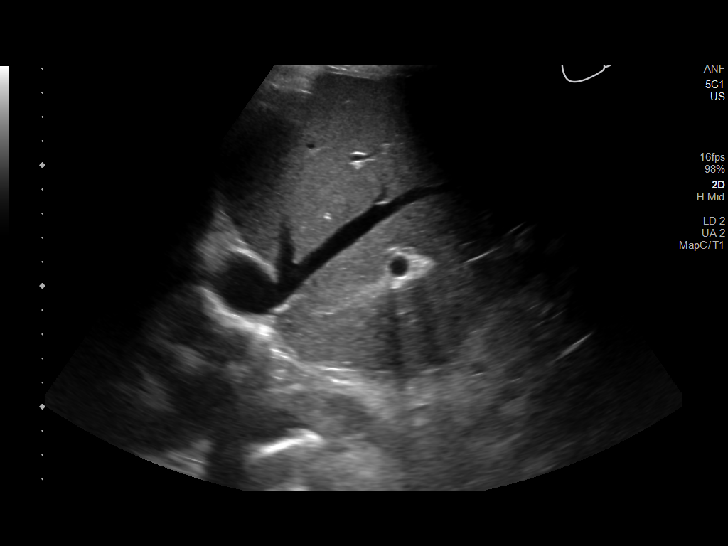
[im 46/61]
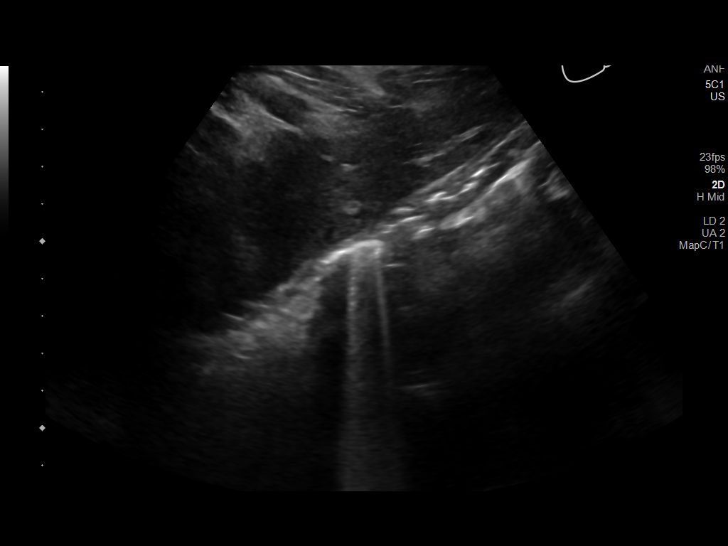
[im 51/61]
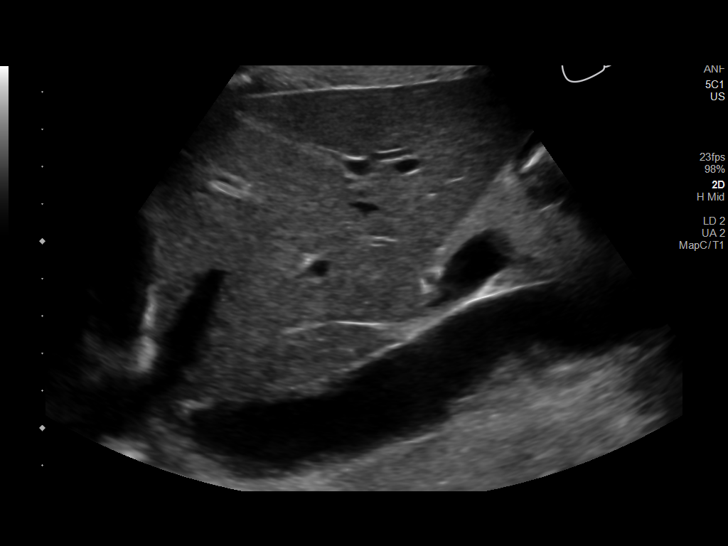
[im 56/61]
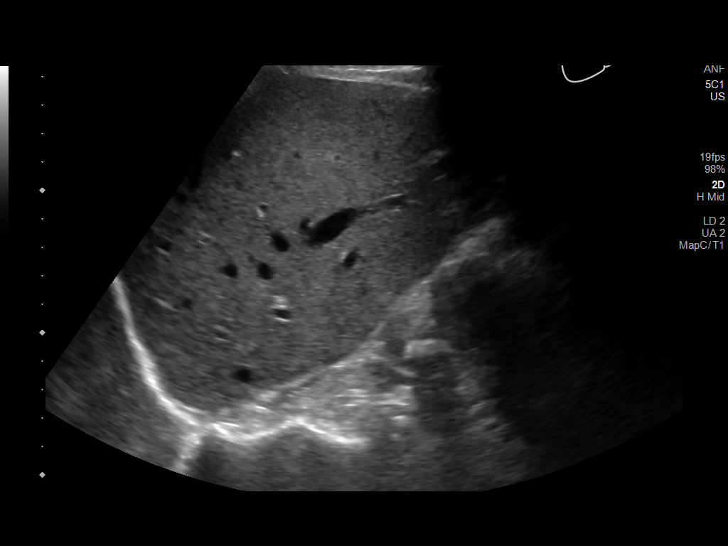
[im 61/61]
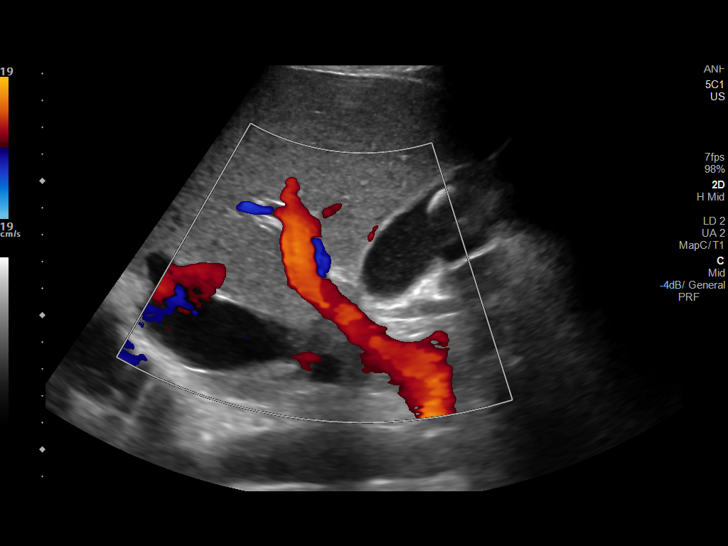

[14 of 25 positions shown; findings below may reference images not displayed]

FINDINGS: Gallbladder:

Moderate cholelithiasis with largest stone measuring 1.8 cm. There
is a non mobile stone over the gallbladder neck. No evidence of
gallbladder wall thickening. Sonographic Murphy sign not reliable as
patient is on pain medication. Tiny amount of pericholecystic fluid.

Common bile duct:

Diameter: 3.8 mm.

Liver:

No focal lesion identified. Within normal limits in parenchymal
echogenicity. Portal vein is patent on color Doppler imaging with
normal direction of blood flow towards the liver.
IMPRESSION: Moderate cholelithiasis without additional sonographic evidence to
suggest cholecystitis.

## 2020-09-22 ENCOUNTER — Encounter (HOSPITAL_COMMUNITY): Payer: Self-pay | Admitting: Emergency Medicine

## 2020-09-22 ENCOUNTER — Emergency Department (HOSPITAL_COMMUNITY)
Admission: EM | Admit: 2020-09-22 | Discharge: 2020-09-22 | Disposition: A | Discharge status: Home / Self Care | Payer: Self-pay | Attending: Emergency Medicine | Admitting: Emergency Medicine

## 2020-09-22 DIAGNOSIS — F17200 Nicotine dependence, unspecified, uncomplicated: Secondary | ICD-10-CM | POA: Insufficient documentation

## 2020-09-22 DIAGNOSIS — Y92410 Unspecified street and highway as the place of occurrence of the external cause: Secondary | ICD-10-CM | POA: Insufficient documentation

## 2020-09-22 DIAGNOSIS — M542 Cervicalgia: Secondary | ICD-10-CM | POA: Insufficient documentation

## 2020-09-22 MED ORDER — NAPROXEN 375 MG PO TABS: 375.0000 mg | ORAL_TABLET | Freq: Two times a day (BID) | ORAL | 0 refills | Status: AC

## 2020-09-22 MED ORDER — METHOCARBAMOL 500 MG PO TABS: 500.0000 mg | ORAL_TABLET | Freq: Two times a day (BID) | ORAL | 0 refills | Status: AC

## 2020-09-22 NOTE — Discharge Instructions (Addendum)
I have prescribed muscle relaxers for your pain, please do not drink or drive while taking this medications as they can make you drowsy.  Please follow-up with PCP in 1 week for reevaluation of your symptoms.  You experience any bowel or bladder incontinence, fever, worsening in your symptoms please return to the ED. ° °
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
## 2021-01-16 IMAGING — US US ABDOMEN LIMITED
1 series · 14 of 25 positions shown · non-contrast
Comparison: March 12, 2019

CLINICAL DATA: Right upper quadrant abdominal pain x5 hours

EXAM:
ULTRASOUND ABDOMEN LIMITED RIGHT UPPER QUADRANT

[Series 1: us abdomen limited · 14 of 61 slices shown]
[im 1/61]
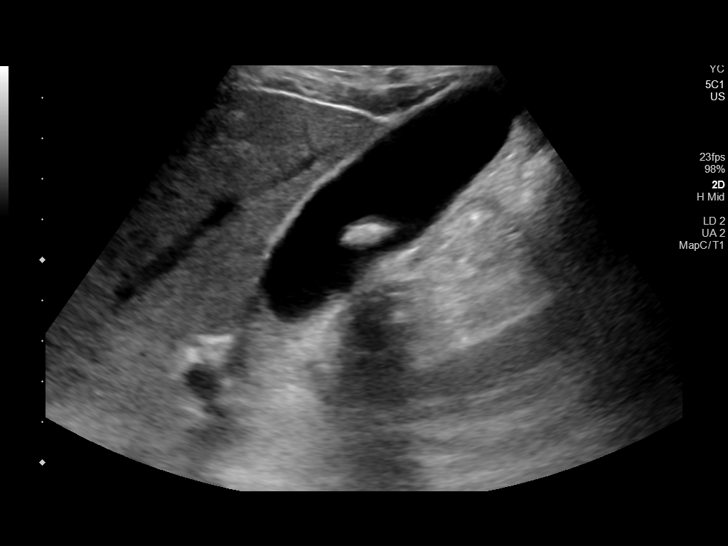
[im 6/61]
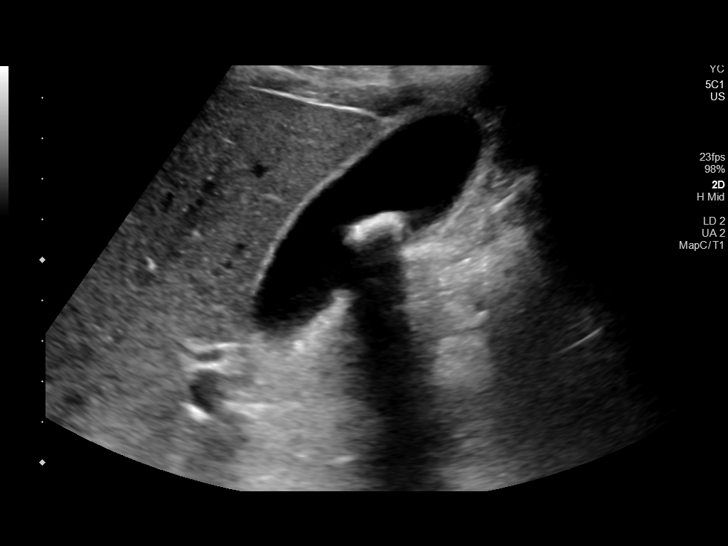
[im 11/61]
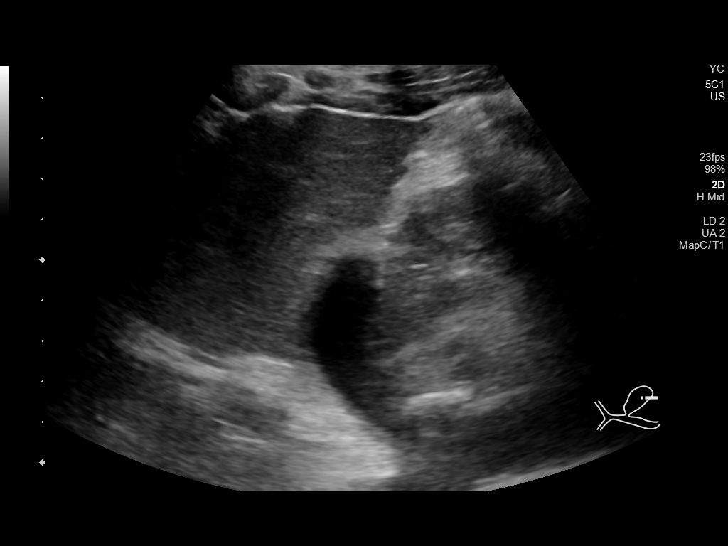
[im 16/61]
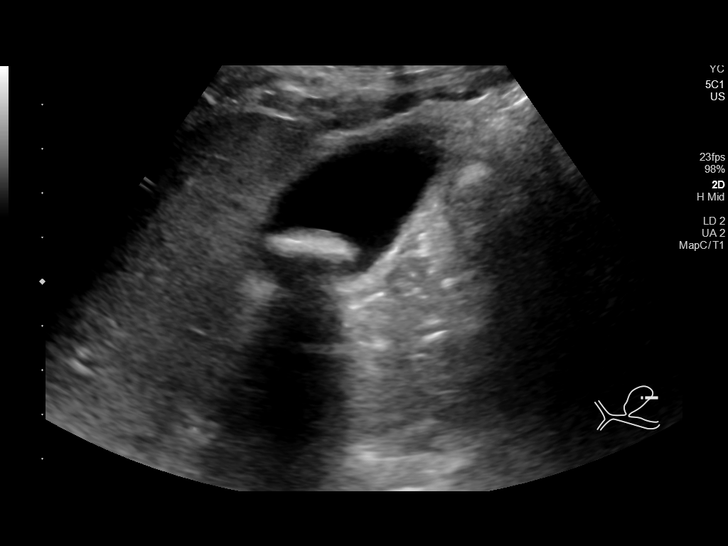
[im 21/61]
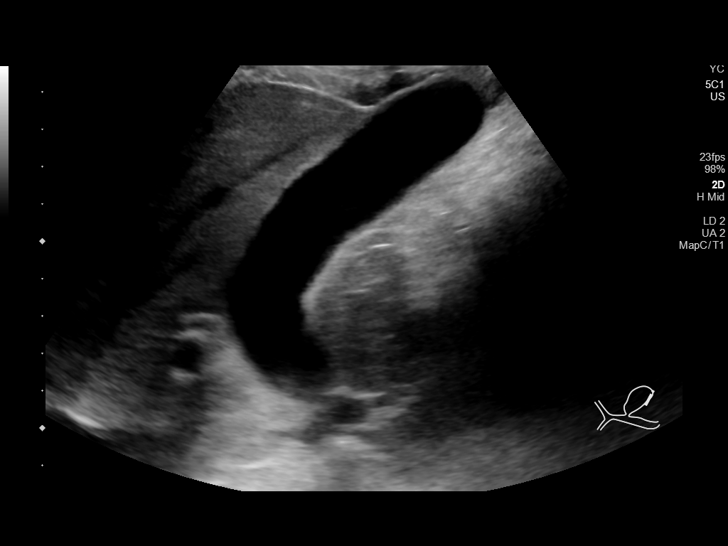
[im 23/61]
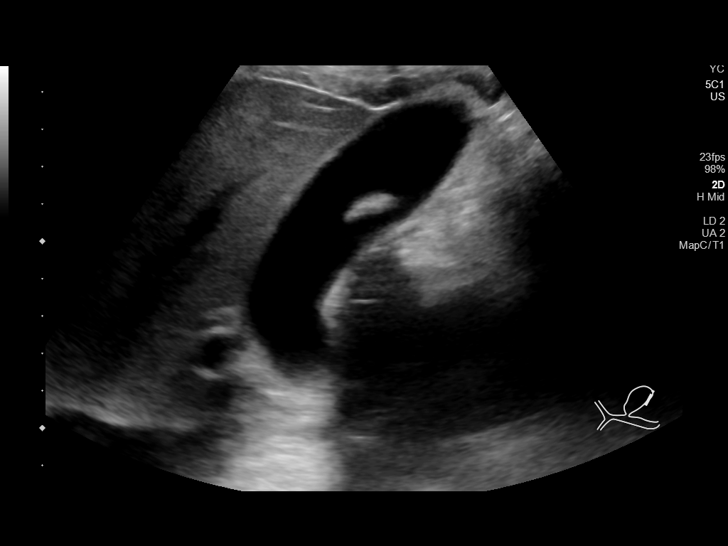
[im 28/61]
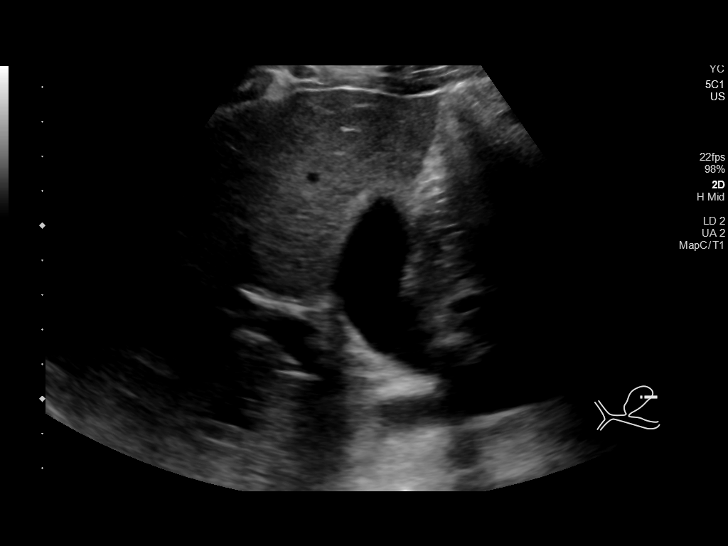
[im 33/61]
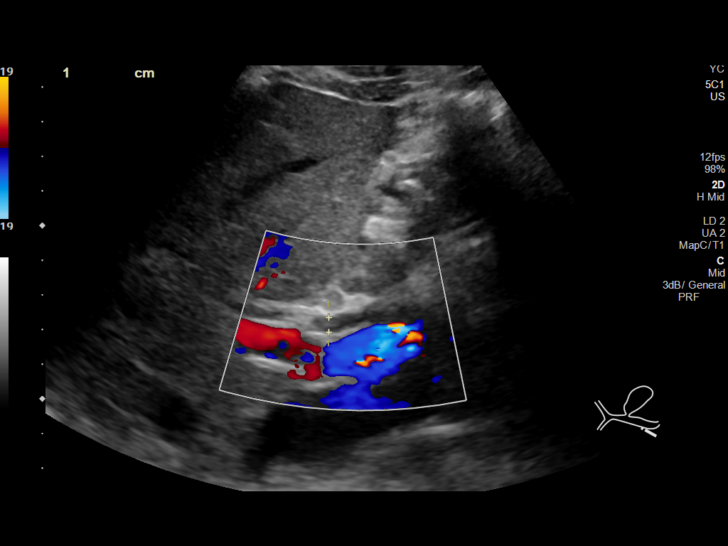
[im 38/61]
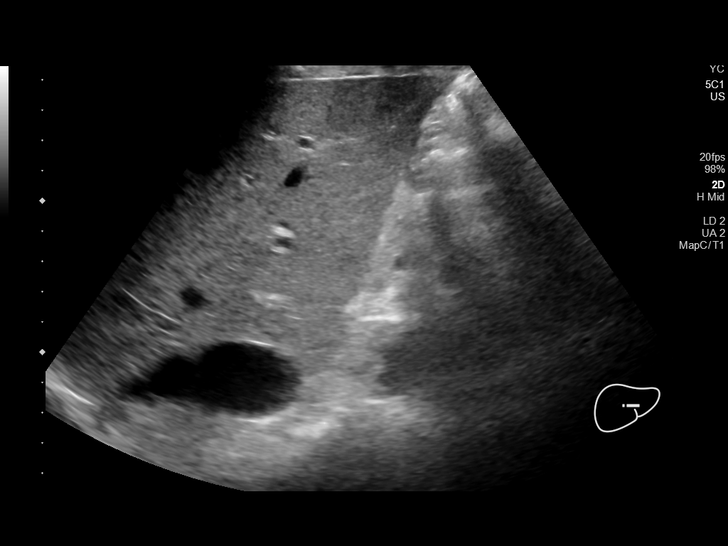
[im 41/61]
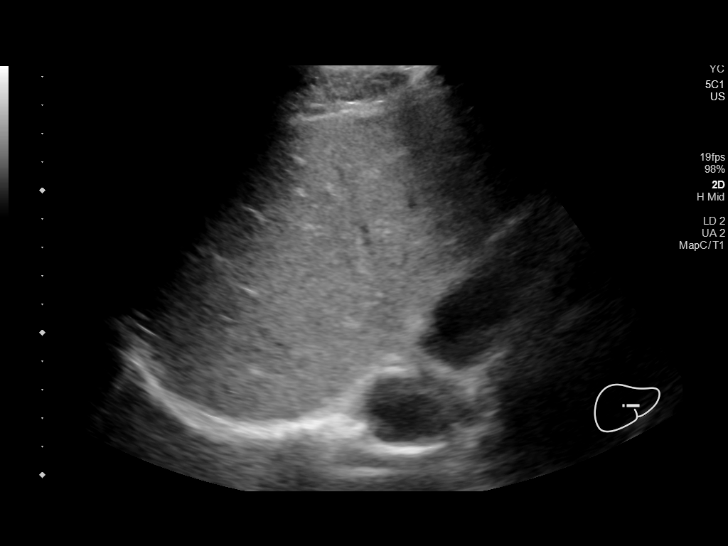
[im 46/61]
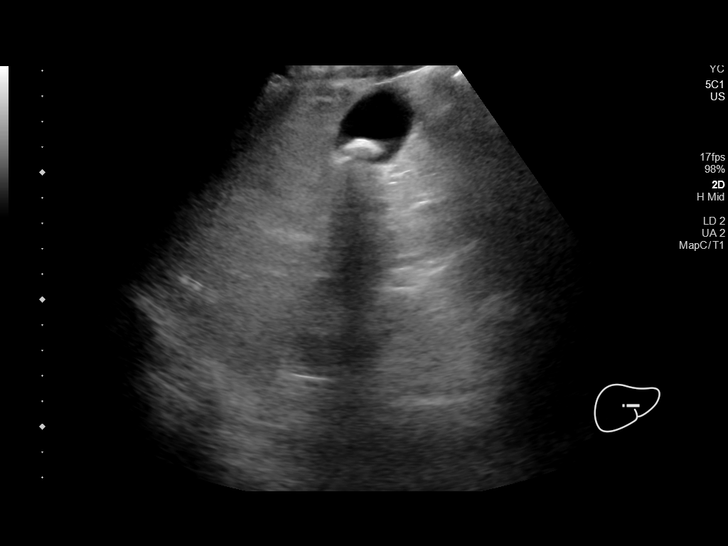
[im 51/61]
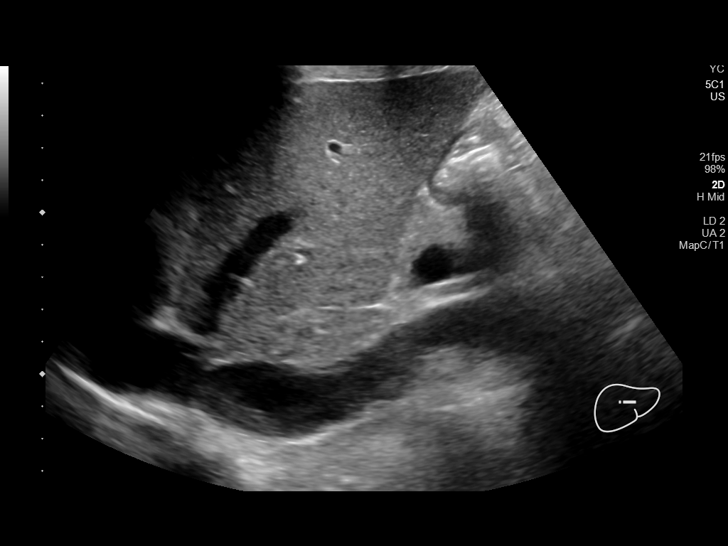
[im 56/61]
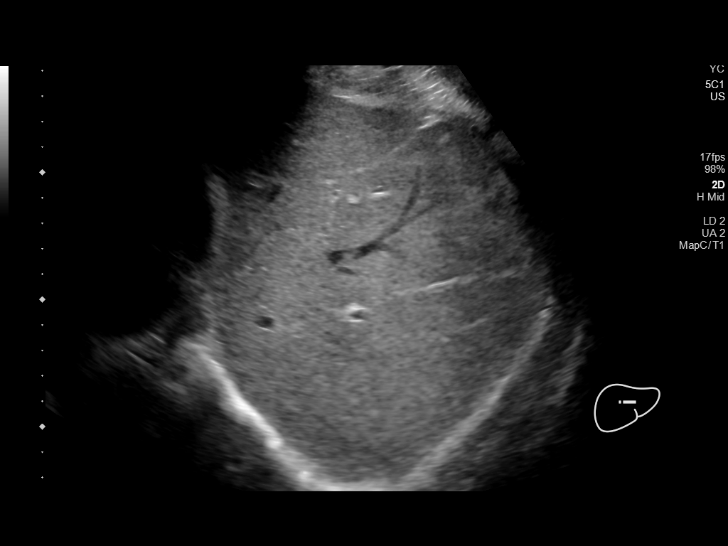
[im 61/61]
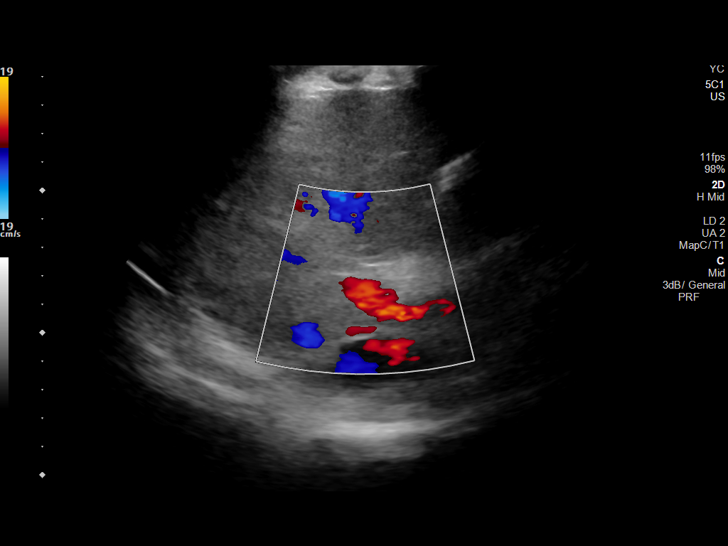

[14 of 25 positions shown; findings below may reference images not displayed]

FINDINGS: Gallbladder:

There is cholelithiasis without evidence for gallbladder wall
thickening or pericholecystic free fluid. The sonographic Murphy
sign is negative.

Common bile duct:

Diameter: 4 mm

Liver:

No focal lesion identified. Within normal limits in parenchymal
echogenicity. Portal vein is patent on color Doppler imaging with
normal direction of blood flow towards the liver.

Other: None.
IMPRESSION: There is cholelithiasis without secondary signs of acute
cholecystitis.

## 2021-02-07 IMAGING — US US ABDOMEN LIMITED
1 series · 14 of 25 positions shown · non-contrast
Comparison: Ultrasound 12/22/2019

CLINICAL DATA: Right upper quadrant pain

EXAM:
ULTRASOUND ABDOMEN LIMITED RIGHT UPPER QUADRANT

[Series 1: us abdomen limited · 14 of 49 slices shown]
[im 1/49]
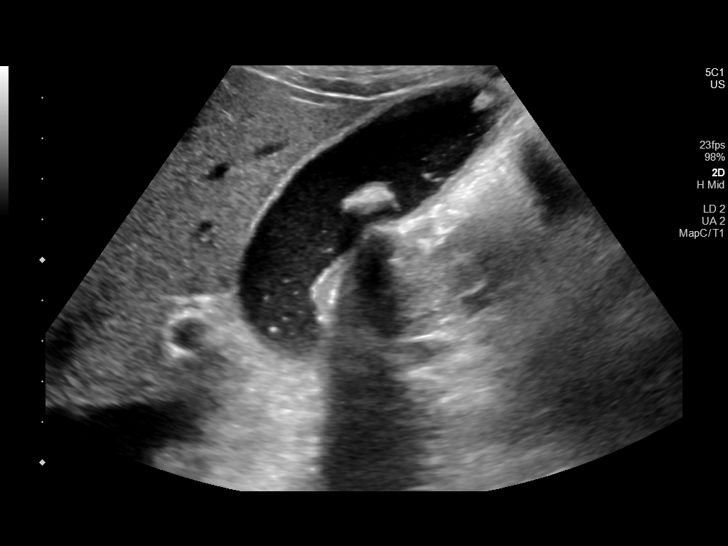
[im 5/49]
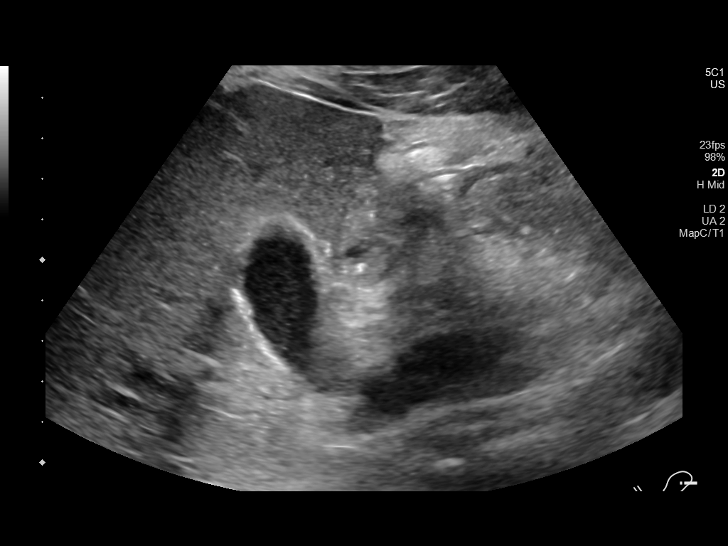
[im 9/49]
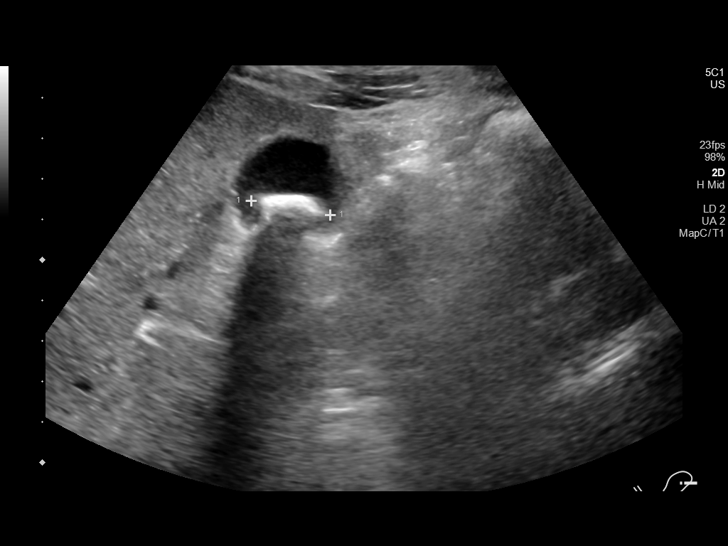
[im 13/49]
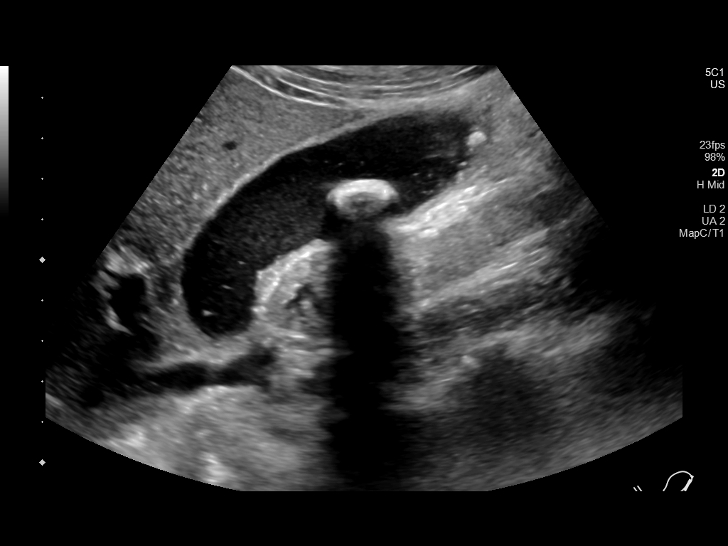
[im 17/49]
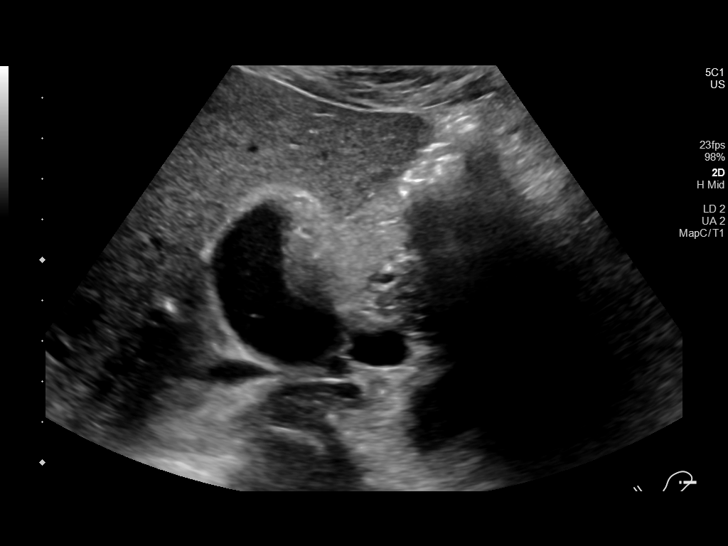
[im 19/49]
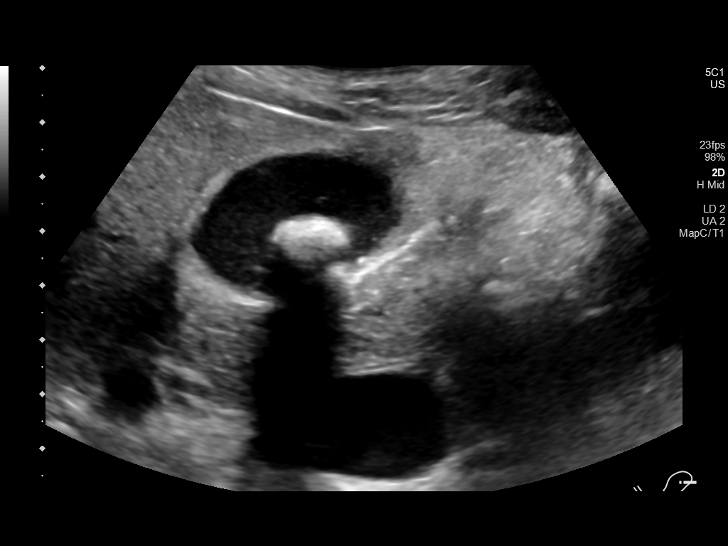
[im 23/49]
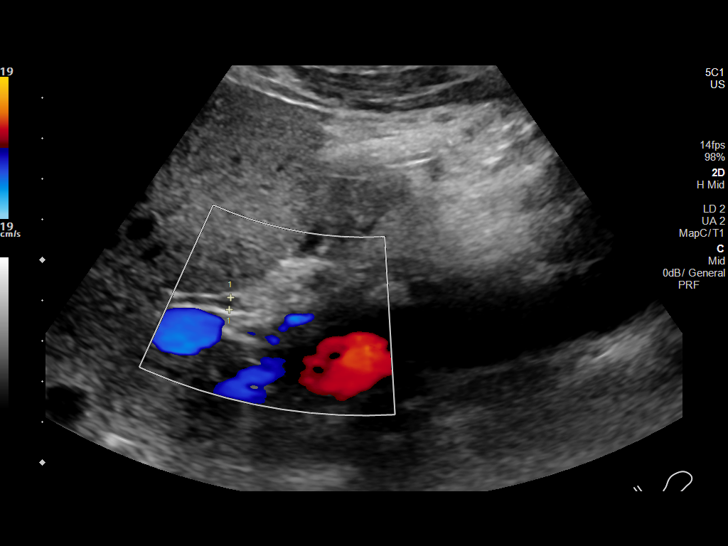
[im 27/49]
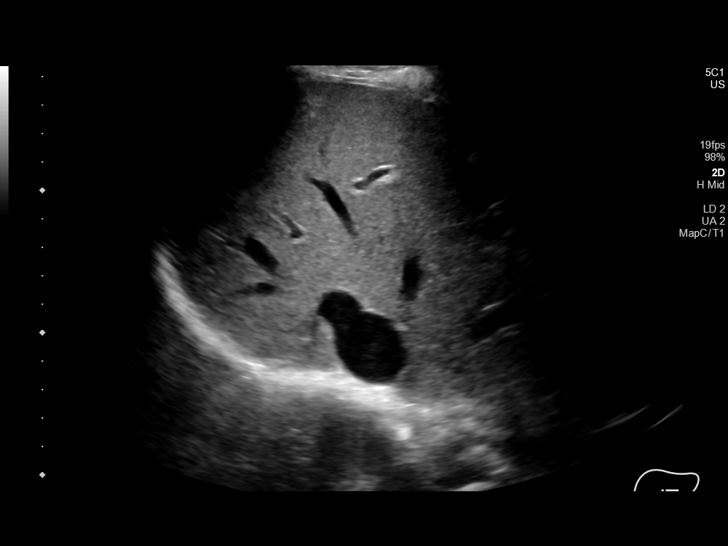
[im 31/49]
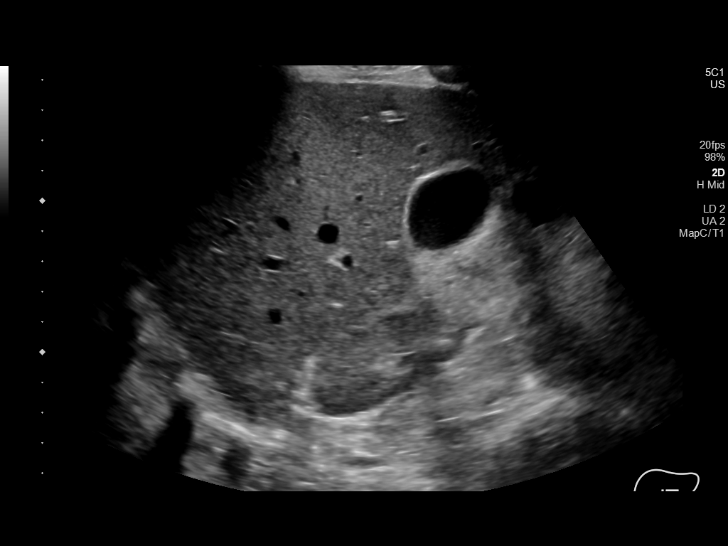
[im 33/49]
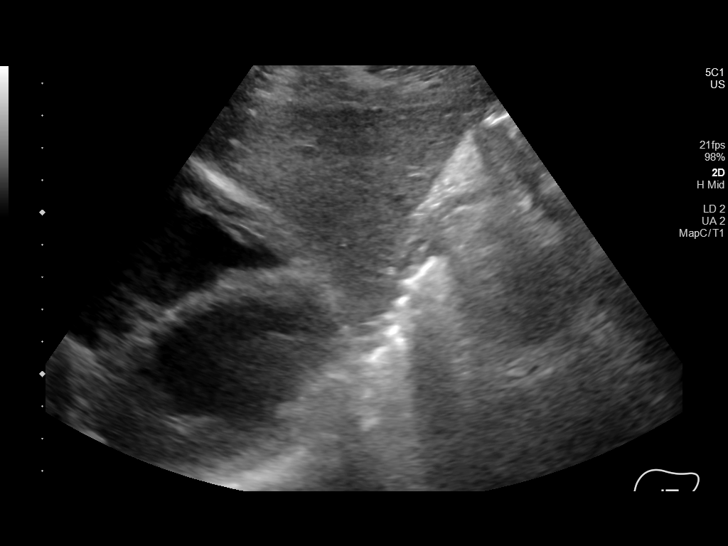
[im 37/49]
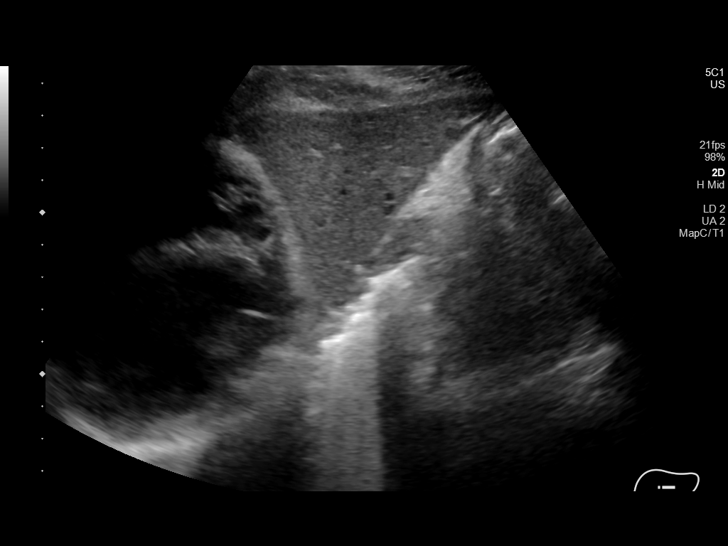
[im 41/49]
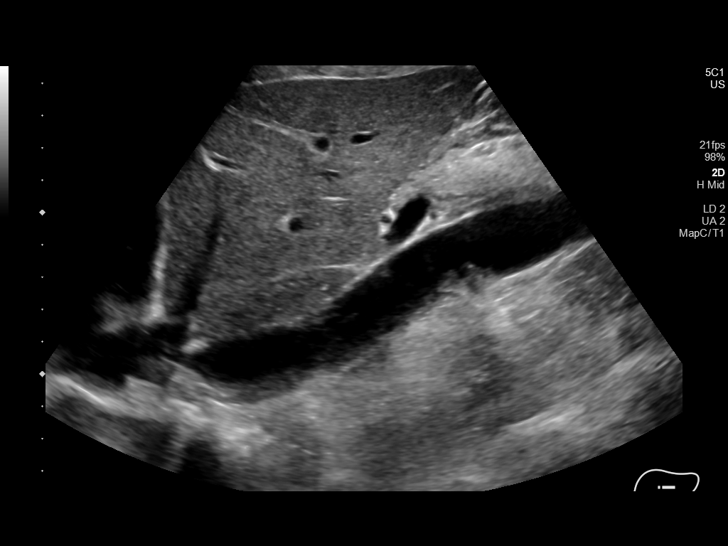
[im 45/49]
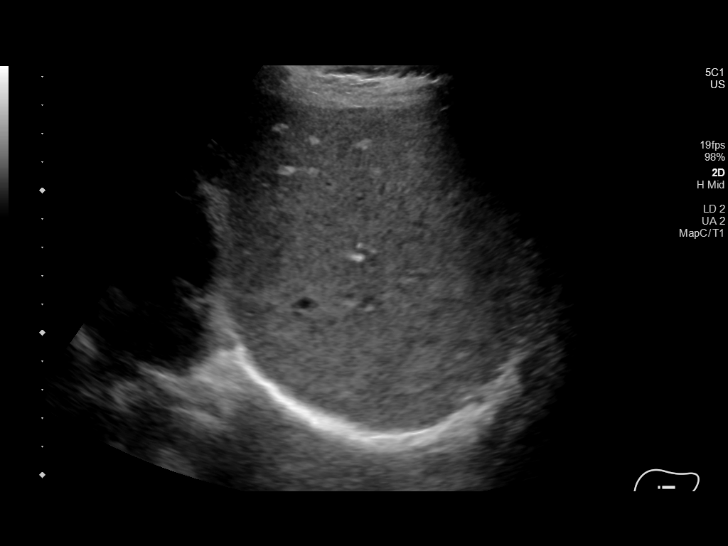
[im 49/49]
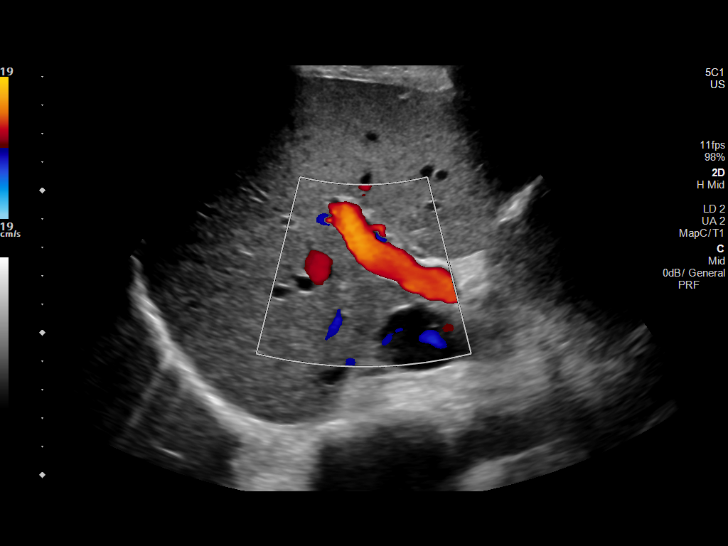

[14 of 25 positions shown; findings below may reference images not displayed]

FINDINGS: Gallbladder:

Gallbladder contains some hypoechoic biliary sludge with additional
punctate echogenic foci which may reflect small punctate gallstones.
Several larger shadowing gallstones are present as well including a
larger 2 cm calculus in the gallbladder body and a 1.7 cm gallstone
near the gallbladder neck. No pericholecystic fluid or gallbladder
wall thickening. Sonographic Murphy sign is reportedly negative.

Common bile duct:

Diameter: 3.2 mm, nondilated

Liver:

No focal lesion identified. Within normal limits in parenchymal
echogenicity. Portal vein is patent on color Doppler imaging with
normal direction of blood flow towards the liver.

Other: None.
IMPRESSION: Cholelithiasis and biliary sludge including several larger
gallstones including a 1.7 cm stone at the gallbladder neck. No
convincing sonographic evidence of acute cholecystitis nor biliary
ductal dilatation is seen however.

## 2024-01-26 ENCOUNTER — Ambulatory Visit (HOSPITAL_COMMUNITY)
Admission: RE | Admit: 2024-01-26 | Discharge: 2024-01-26 | Disposition: A | Discharge status: Home / Self Care | Source: Ambulatory Visit

## 2024-01-26 ENCOUNTER — Encounter (HOSPITAL_COMMUNITY): Payer: Self-pay

## 2024-01-26 VITALS — BP 119/70 | HR 65 | Temp 98.3°F | Resp 16

## 2024-01-26 DIAGNOSIS — K047 Periapical abscess without sinus: Secondary | ICD-10-CM

## 2024-01-26 MED ORDER — AMOXICILLIN-POT CLAVULANATE 875-125 MG PO TABS: 1.0000 | ORAL_TABLET | Freq: Two times a day (BID) | ORAL | 0 refills | Status: AC

## 2024-01-26 MED ORDER — DICLOFENAC SODIUM 50 MG PO TBEC: 50.0000 mg | DELAYED_RELEASE_TABLET | Freq: Two times a day (BID) | ORAL | 1 refills | Status: AC

## 2024-01-26 NOTE — ED Triage Notes (Signed)
 Patient here today with c/o right upper dental pain and facial swelling since Tuesday evening. Seems to be worsening.

## 2024-01-26 NOTE — ED Provider Notes (Signed)
 [facility]  Note:  This document was prepared using Dragon voice recognition software and may include unintentional dictation errors.  MRN: [id number] DOB: [birth year]  Subjective:   Walter Carr. is a 58 y.o. male presenting for dental pain since Tuesday evening.  Patient reports past history of dental pain with similar presentation but not years.  Patient has not seen a dentist facial swelling seems to be getting worse.  Patient reports taking Advil 800 mg symptoms.  Patient does not have a dentist currently but would like to follow-up with a dentist for evaluation and treatment.  Patient denies any other medical concerns at this time.  No current facility-administered medications for this encounter.  Current Outpatient Medications:    amoxicillin-clavulanate (AUGMENTIN) 875-125 MG tablet, Take 1 tablet by mouth every 12 (twelve) hours., Disp: 14 tablet, Rfl: 0   diclofenac (VOLTAREN) 50 MG EC tablet, Take 1 tablet (50 mg total) by mouth 2 (two) times daily., Disp: 30 tablet, Rfl: 1   QUEtiapine Fumarate (SEROQUEL XR) 150 MG 24 hr tablet, Take 150 mg by mouth at bedtime., Disp: , Rfl:    Allergies  Allergen Reactions   Fish Allergy Nausea And Vomiting   Elavil [Amitriptyline Hcl] Palpitations    Past Medical History:  Diagnosis Date   Depression    Gallstones      Past Surgical History:  Procedure Laterality Date   CHOLECYSTECTOMY N/A 01/13/2020   Procedure: LAPAROSCOPIC CHOLECYSTECTOMY WITH INTRAOPERATIVE CHOLANGIOGRAM;  Surgeon: Juanita Norlander, MD;  Location: [facility];  Service: General;  Laterality: N/A;    History reviewed. No pertinent family history.  Social History   Tobacco Use   Smoking status: Every Day   Smokeless tobacco: Never  Vaping Use   Vaping status: Some Days   Devices: 'seldom"  Substance Use Topics   Alcohol use: Not Currently   Drug use: Not Currently    ROS Refer to HPI for ROS details.  Objective:   Vitals: BP 119/70 (BP  Location: Left Arm)   Pulse 65   Temp 98.3 F (36.8 C) (Oral)   Resp 16   SpO2 96%   Physical Exam Vitals and nursing note reviewed.  Constitutional:      General: He is not in acute distress.    Appearance: Normal appearance. He is well-developed. He is not ill-appearing or toxic-appearing.  HENT:     Head: Normocephalic and atraumatic.     Mouth/Throat:     Mouth: Mucous membranes are moist.     Dentition: Abnormal dentition. Dental tenderness, gingival swelling and dental caries present. No dental abscesses or gum lesions.     Pharynx: Oropharynx is clear.  Cardiovascular:     Rate and Rhythm: Normal rate.  Pulmonary:     Effort: Pulmonary effort is normal. No respiratory distress.  Skin:    General: Skin is warm and dry.  Neurological:     General: No focal deficit present.     Mental Status: He is alert and oriented to person, place, and time.  Psychiatric:        Mood and Affect: Mood normal.        Behavior: Behavior normal.     Procedures  No results found for this or any previous visit (from the past 24 hours).  No results found.   Assessment and Plan :     Discharge Instructions       1. Dental infection (Primary) - diclofenac (VOLTAREN) 50 MG EC tablet; Take 1 tablet (50  mg total) by mouth 2 (two) times daily.  Dispense: 30 tablet; Refill: 1 - amoxicillin-clavulanate (AUGMENTIN) 875-125 MG tablet; Take 1 tablet by mouth every 12 (twelve) hours.  Dispense: 14 tablet; Refill: 0 - Wash mouth with Listerine or other mouthwash 2-3 times a day prevent worsening infection. -Follow-up with dentist for further evaluation and ongoing management of infection and possible need for tooth extraction. -Continue to monitor symptoms for any change in severity if there is any escalation of current symptoms or development of new symptoms follow-up in ER for further evaluation and management.    Timmia Cogburn B Jurell Basista   Admiral Marcucci, [place], [place] 01/26/24 1037

## 2024-01-26 NOTE — Discharge Instructions (Signed)
  1. Dental infection (Primary) - diclofenac (VOLTAREN) 50 MG EC tablet; Take 1 tablet (50 mg total) by mouth 2 (two) times daily.  Dispense: 30 tablet; Refill: 1 - amoxicillin-clavulanate (AUGMENTIN) 875-125 MG tablet; Take 1 tablet by mouth every 12 (twelve) hours.  Dispense: 14 tablet; Refill: 0 - Wash mouth with Listerine or other mouthwash 2-3 times a day prevent worsening infection. -Follow-up with dentist for further evaluation and ongoing management of infection and possible need for tooth extraction. -Continue to monitor symptoms for any change in severity if there is any escalation of current symptoms or development of new symptoms follow-up in ER for further evaluation and management.

## 2024-08-16 ENCOUNTER — Other Ambulatory Visit: Payer: Self-pay

## 2024-08-16 ENCOUNTER — Ambulatory Visit
Admission: EM | Admit: 2024-08-16 | Discharge: 2024-08-16 | Disposition: A | Discharge status: Home / Self Care | Attending: Physician Assistant | Admitting: Physician Assistant

## 2024-08-16 DIAGNOSIS — R0989 Other specified symptoms and signs involving the circulatory and respiratory systems: Secondary | ICD-10-CM | POA: Diagnosis not present

## 2024-08-16 LAB — POCT INFLUENZA A/B
Influenza A, POC: NEGATIVE
Influenza B, POC: NEGATIVE

## 2024-08-16 LAB — POC SOFIA SARS ANTIGEN FIA: SARS Coronavirus 2 Ag: NEGATIVE

## 2024-08-16 NOTE — ED Triage Notes (Signed)
 Pt presents with c/o headaches, chills, lightheadedness, and chest congestion. Symptom onset was about four days ago. Currently rates overall pain a 6/10. Two leftover amoxicillin  taken at the onset of symptoms. Other individuals in household are sick with similar symptoms. Does endorse SOB. 99% O2 on room air.

## 2024-08-16 NOTE — ED Provider Notes (Signed)
 [facility]    CSN: [id number] Arrival date & time: 08/16/24  9052      History   Chief Complaint Chief Complaint  Patient presents with   Chills   Dizziness    HPI Walter Carr. is a 58 y.o. male.   HPI  Pt is here today with chest congestion and productive coughing. He states this has been ongoing since Monday, 12/22 and started with headaches, chills, lightheadedness. He states he has had some wheezing but denies SOB. He denies previous hx of asthma or COPD.  He reports that 2 others in his household have similar symptoms.   Interventions: tylenol  and Nyquil but is not taking anything during the day   Past Medical History:  Diagnosis Date   Depression    Gallstones     There are no active problems to display for this patient.   Past Surgical History:  Procedure Laterality Date   CHOLECYSTECTOMY N/A 01/13/2020   Procedure: LAPAROSCOPIC CHOLECYSTECTOMY WITH INTRAOPERATIVE CHOLANGIOGRAM;  Surgeon: Ethyl Lenis, MD;  Location: [facility];  Service: General;  Laterality: N/A;       Home Medications    Prior to Admission medications  Medication Sig Start Date End Date Taking? Authorizing Provider  amoxicillin -clavulanate (AUGMENTIN ) 875-125 MG tablet Take 1 tablet by mouth every 12 (twelve) hours. 01/26/24  Yes Reddick, Johnathan B, NP  diclofenac  (VOLTAREN ) 50 MG EC tablet Take 1 tablet (50 mg total) by mouth 2 (two) times daily. 01/26/24   Reddick, Johnathan B, NP  QUEtiapine Fumarate (SEROQUEL XR) 150 MG 24 hr tablet Take 150 mg by mouth at bedtime. 12/10/18   [provider]    Family History History reviewed. No pertinent family history.  Social History Social History[1]   Allergies   Fish allergy and Elavil [amitriptyline hcl]   Review of Systems Review of Systems  Constitutional:  Positive for chills. Negative for fever.  HENT:  Positive for congestion. Negative for ear pain and sore throat.   Respiratory:  Positive for cough  and wheezing. Negative for shortness of breath.   Gastrointestinal:  Negative for diarrhea, nausea and vomiting.  Musculoskeletal:  Positive for myalgias.  Neurological:  Positive for headaches.     Physical Exam Triage Vital Signs ED Triage Vitals  Encounter Vitals Group     BP 08/16/24 1133 113/74     Girls Systolic BP Percentile --      Girls Diastolic BP Percentile --      Boys Systolic BP Percentile --      Boys Diastolic BP Percentile --      Pulse Rate 08/16/24 1133 65     Resp 08/16/24 1133 16     Temp 08/16/24 1133 97.6 F (36.4 C)     Temp Source 08/16/24 1133 Oral     SpO2 08/16/24 1133 99 %     Weight 08/16/24 1133 135 lb (61.2 kg)     Height 08/16/24 1133 5' 8 (1.727 m)     Head Circumference --      Peak Flow --      Pain Score 08/16/24 1149 6     Pain Loc --      Pain Education --      Exclude from Growth Chart --    No data found.  Updated Vital Signs BP 113/74 (BP Location: Right Arm)   Pulse 65   Temp 97.6 F (36.4 C) (Oral)   Resp 16   Ht 5' 8 (1.727 m)  Wt 135 lb (61.2 kg)   SpO2 99%   BMI 20.53 kg/m   Visual Acuity Right Eye Distance:   Left Eye Distance:   Bilateral Distance:    Right Eye Near:   Left Eye Near:    Bilateral Near:     Physical Exam Vitals reviewed.  Constitutional:      General: He is awake. He is not in acute distress.    Appearance: Normal appearance. He is well-developed and well-groomed. He is not ill-appearing, toxic-appearing or diaphoretic.  HENT:     Head: Normocephalic and atraumatic.     Right Ear: Hearing, tympanic membrane and ear canal normal.     Left Ear: Hearing, tympanic membrane and ear canal normal.     Mouth/Throat:     Lips: Pink.     Mouth: Mucous membranes are moist.     Pharynx: Oropharynx is clear. Uvula midline. Posterior oropharyngeal erythema and postnasal drip present. No pharyngeal swelling, oropharyngeal exudate or uvula swelling.     Tonsils: No tonsillar exudate or tonsillar  abscesses. 0 on the right. 0 on the left.  Eyes:     General: Lids are normal. Gaze aligned appropriately.     Extraocular Movements: Extraocular movements intact.  Cardiovascular:     Rate and Rhythm: Normal rate and regular rhythm.     Heart sounds: Normal heart sounds.  Pulmonary:     Effort: Pulmonary effort is normal.     Breath sounds: Normal breath sounds. No decreased air movement. No decreased breath sounds, wheezing, rhonchi or rales.  Musculoskeletal:     Cervical back: Normal range of motion and neck supple.  Lymphadenopathy:     Head:     Right side of head: No submental, submandibular or preauricular adenopathy.     Left side of head: No submental, submandibular or preauricular adenopathy.     Cervical:     Right cervical: No superficial cervical adenopathy.    Left cervical: No superficial cervical adenopathy.     Upper Body:     Right upper body: No supraclavicular adenopathy.     Left upper body: No supraclavicular adenopathy.  Skin:    General: Skin is warm and dry.  Neurological:     General: No focal deficit present.     Mental Status: He is alert and oriented to person, place, and time.  Psychiatric:        Mood and Affect: Mood normal.        Behavior: Behavior normal. Behavior is cooperative.        Thought Content: Thought content normal.        Judgment: Judgment normal.      UC Treatments / Results  Labs (all labs ordered are listed, but only abnormal results are displayed) Labs Reviewed  POCT INFLUENZA A/B  POC SOFIA SARS ANTIGEN FIA    EKG   Radiology No results found.  Procedures Procedures (including critical care time)  Medications Ordered in UC Medications - No data to display  Initial Impression / Assessment and Plan / UC Course  I have reviewed the triage vital signs and the nursing notes.  Pertinent labs & imaging results that were available during my care of the patient were reviewed by me and considered in my medical  decision making (see chart for details).      Final Clinical Impressions(s) / UC Diagnoses   Final diagnoses:  Symptoms of upper respiratory infection (URI)   Visit with patient indicates symptoms comprised of  coughing, wheezing, nasal congestion for the past 5 days congruent with acute URI that is likely viral in nature  Patient has tested negative for COVID and flu in clinic today Due to nature and duration of symptoms recommended treatment regimen is symptomatic relief and follow up if needed Discussed with patient the various viral and bacterial etiologies of current illness and appropriate course of treatment Discussed OTC medication options for multisymptom relief such as Dayquil/Nyquil, Theraflu, AlkaSeltzer, etc. Discussed return precautions if symptoms are not improving or worsen over next 5-7 days.  ED and return precautions reviewed and provided in AVS. Follow up as needed for persistent or progressing symptoms      Discharge Instructions      Your testing was negative for COVID and Flu  Based on your described symptoms and the duration of symptoms it is likely that you have a viral upper respiratory infection (often called a cold)  Symptoms can last for 3-10 days with lingering cough and intermittent symptoms potentially  lasting several  weeks after that.  The goal of treatment at this time is to reduce your symptoms and discomfort   You can use the following medications and measures to help yourself feel better until your body fights this off: DayQuil/NyQuil, TheraFlu, Alka-Seltzer  (these medications typically have the same active ingredients in them so you can choose whichever one you prefer and take consistently during the day and night according to the manufactures instructions.) Flonase A daily antihistamine such as Zyrtec, Claritin, Allegra per your preference.  Please choose 1 and take consistently. Increased fluids.  It is recommended that you take in at  least 64 ounces of water per day when you are not sick so it is important to increase this when you are sick and your body may be running fever. Rest Cough drops Chloraseptic throat spray to help with sore throat Nasal saline spray or nasal flushes to help with congestion and runny nose  If your symptoms seem like they are getting worse over the next 5 to 7 days or not improving you can always follow-up here in urgent care or go to your primary care provider for further management. Go to the ER if you begin to have more serious symptoms such as shortness of breath, trouble breathing, loss of consciousness, swelling around the eyes, high fever, severe lasting headaches, vision changes or neck pain/stiffness.       ED Prescriptions   None    PDMP not reviewed this encounter.     [1]  Social History Tobacco Use   Smoking status: Every Day   Smokeless tobacco: Never  Vaping Use   Vaping status: Some Days   Devices: 'seldom  Substance Use Topics   Alcohol use: Not Currently   Drug use: Not Currently     Maryum Batterson, [place] BRAVO, PA-C 08/16/24 1234  "

## 2024-08-16 NOTE — Discharge Instructions (Addendum)
 Your testing was negative for COVID and Flu  Based on your described symptoms and the duration of symptoms it is likely that you have a viral upper respiratory infection (often called a cold)  Symptoms can last for 3-10 days with lingering cough and intermittent symptoms potentially  lasting several  weeks after that.  The goal of treatment at this time is to reduce your symptoms and discomfort   You can use the following medications and measures to help yourself feel better until your body fights this off: DayQuil/NyQuil, TheraFlu, Alka-Seltzer  (these medications typically have the same active ingredients in them so you can choose whichever one you prefer and take consistently during the day and night according to the manufactures instructions.) Flonase A daily antihistamine such as Zyrtec, Claritin, Allegra per your preference.  Please choose 1 and take consistently. Increased fluids.  It is recommended that you take in at least 64 ounces of water per day when you are not sick so it is important to increase this when you are sick and your body may be running fever. Rest Cough drops Chloraseptic throat spray to help with sore throat Nasal saline spray or nasal flushes to help with congestion and runny nose  If your symptoms seem like they are getting worse over the next 5 to 7 days or not improving you can always follow-up here in urgent care or go to your primary care provider for further management. Go to the ER if you begin to have more serious symptoms such as shortness of breath, trouble breathing, loss of consciousness, swelling around the eyes, high fever, severe lasting headaches, vision changes or neck pain/stiffness.
